# Patient Record
Sex: Female | Born: 1995 | Race: Black or African American | Hispanic: No | Marital: Single | Smoking: Never smoker
Health system: Southern US, Community
[De-identification: ages and names within clinical notes are randomized; demographics above are authoritative.]

## PROBLEM LIST (undated history)

## (undated) DIAGNOSIS — J45909 Unspecified asthma, uncomplicated: Secondary | ICD-10-CM

## (undated) DIAGNOSIS — F32A Depression, unspecified: Secondary | ICD-10-CM

## (undated) DIAGNOSIS — F419 Anxiety disorder, unspecified: Secondary | ICD-10-CM

## (undated) DIAGNOSIS — K589 Irritable bowel syndrome without diarrhea: Secondary | ICD-10-CM

## (undated) DIAGNOSIS — K2970 Gastritis, unspecified, without bleeding: Secondary | ICD-10-CM

## (undated) DIAGNOSIS — K219 Gastro-esophageal reflux disease without esophagitis: Secondary | ICD-10-CM

## (undated) DIAGNOSIS — M1990 Unspecified osteoarthritis, unspecified site: Secondary | ICD-10-CM

## (undated) HISTORY — PX: WISDOM TOOTH EXTRACTION: SHX21

## (undated) HISTORY — DX: Anxiety disorder, unspecified: F41.9

## (undated) HISTORY — DX: Unspecified osteoarthritis, unspecified site: M19.90

## (undated) HISTORY — DX: Depression, unspecified: F32.A

---
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
## 2018-11-03 ENCOUNTER — Emergency Department (HOSPITAL_COMMUNITY)
Admission: EM | Admit: 2018-11-03 | Discharge: 2018-11-03 | Disposition: A | Discharge status: Home / Self Care | Payer: 59 | Attending: Emergency Medicine | Admitting: Emergency Medicine

## 2018-11-03 ENCOUNTER — Encounter (HOSPITAL_COMMUNITY): Payer: Self-pay

## 2018-11-03 ENCOUNTER — Other Ambulatory Visit: Payer: Self-pay

## 2018-11-03 ENCOUNTER — Emergency Department (HOSPITAL_COMMUNITY): Payer: 59

## 2018-11-03 DIAGNOSIS — F419 Anxiety disorder, unspecified: Secondary | ICD-10-CM | POA: Insufficient documentation

## 2018-11-03 DIAGNOSIS — R0602 Shortness of breath: Secondary | ICD-10-CM | POA: Diagnosis present

## 2018-11-03 LAB — BASIC METABOLIC PANEL
Anion gap: 12 (ref 5–15)
BUN: 11 mg/dL (ref 6–20)
CO2: 22 mmol/L (ref 22–32)
Calcium: 9.3 mg/dL (ref 8.9–10.3)
Chloride: 104 mmol/L (ref 98–111)
Creatinine, Ser: 0.76 mg/dL (ref 0.44–1.00)
GFR calc Af Amer: >60 mL/min (ref >60)
GFR calc non Af Amer: >60 mL/min (ref >60)
Glucose, Bld: 109 mg/dL — ABNORMAL HIGH (ref 70–99)
Potassium: 2.8 mmol/L — ABNORMAL LOW (ref 3.5–5.1)
Sodium: 138 mmol/L (ref 135–145)

## 2018-11-03 LAB — CBC
HCT: 38.6 % (ref 36.0–46.0)
Hemoglobin: 12.3 g/dL (ref 12.0–15.0)
MCH: 32.6 pg (ref 26.0–34.0)
MCHC: 31.9 g/dL (ref 30.0–36.0)
MCV: 102.4 fL — ABNORMAL HIGH (ref 80.0–100.0)
Platelets: 242 10*3/uL (ref 150–400)
RBC: 3.77 MIL/uL — ABNORMAL LOW (ref 3.87–5.11)
RDW: 12.5 % (ref 11.5–15.5)
WBC: 8.9 10*3/uL (ref 4.0–10.5)
nRBC: 0 % (ref 0.0–0.2)

## 2018-11-03 LAB — I-STAT BETA HCG BLOOD, ED (MC, WL, AP ONLY): I-stat hCG, quantitative: <5 m[IU]/mL (ref <5)

## 2018-11-03 MED ORDER — ALBUTEROL SULFATE (2.5 MG/3ML) 0.083% IN NEBU
5.0000 mg | INHALATION_SOLUTION | Freq: Once | RESPIRATORY_TRACT | Status: AC
  Administered 2018-11-03: 5 mg via RESPIRATORY_TRACT
  Filled 2018-11-03: qty 6

## 2018-11-03 MED ORDER — PREDNISONE 20 MG PO TABS
60.0000 mg | ORAL_TABLET | Freq: Once | ORAL | Status: AC
  Administered 2018-11-03: 60 mg via ORAL
  Filled 2018-11-03: qty 3

## 2018-11-03 NOTE — ED Triage Notes (Signed)
Pt reports asthma attack starting about 30 mins ago. She reports that she used her rescue inhaler without relief. Wheezing and dry cough noted. A&Ox4.

## 2018-11-03 NOTE — ED Notes (Signed)
Patient transported to X-ray 

## 2018-11-03 NOTE — ED Provider Notes (Signed)
[facility]   CSN: [id number] Arrival date & time: 11/03/18  1523     History   Chief Complaint Chief Complaint  Patient presents with  . Asthma    HPI Michele Tate is a 23 y.o. female.  HPI Patient is a 23 year old female presents the emergency department he developed acute onset shortness of breath.  She has a history of asthma and reports no significant improvement with her bronchodilators.  She was wheezing for EMS.  She was given albuterol with some improvement in her symptoms.  She states that she still feels slightly anxious.  No chest pain.  No palpitations.  No syncope.  No abdominal pain.  No fevers or chills.  Some cough this week.  No other complaints.  Otherwise healthy 23 year old female.  No history of DVT or pulmonary embolism.  No family history of venous thromboembolic disease.   History reviewed. No pertinent past medical history.  There are no active problems to display for this patient.   History reviewed. No pertinent surgical history.   OB History   No obstetric history on file.      Home Medications    Prior to Admission medications   Not on File    Family History History reviewed. No pertinent family history.  Social History Social History   Tobacco Use  . Smoking status: Never Smoker  Substance Use Topics  . Alcohol use: No  . Drug use: No     Allergies   Penicillins   Review of Systems Review of Systems  All other systems reviewed and are negative.    Physical Exam Updated Vital Signs BP 132/86 (BP Location: Left Arm)   Pulse 86   Temp 98.1 F (36.7 C) (Oral)   Resp 20   LMP 10/29/2018   SpO2 100%   Physical Exam Vitals signs and nursing note reviewed.  Constitutional:      General: She is not in acute distress.    Appearance: She is well-developed.  HENT:     Head: Normocephalic and atraumatic.  Neck:     Musculoskeletal: Normal range of motion.    Cardiovascular:     Rate and Rhythm: Normal rate and regular rhythm.     Heart sounds: Normal heart sounds.  Pulmonary:     Effort: Pulmonary effort is normal.     Breath sounds: Normal breath sounds.  Abdominal:     General: There is no distension.     Palpations: Abdomen is soft.     Tenderness: There is no abdominal tenderness.  Musculoskeletal: Normal range of motion.  Skin:    General: Skin is warm and dry.  Neurological:     Mental Status: She is alert and oriented to person, place, and time.  Psychiatric:        Judgment: Judgment normal.      ED Treatments / Results  Labs (all labs ordered are listed, but only abnormal results are displayed) Labs Reviewed  CBC - Abnormal; Notable for the following components:      Result Value   RBC 3.77 (*)    MCV 102.4 (*)    All other components within normal limits  BASIC METABOLIC PANEL - Abnormal; Notable for the following components:   Potassium 2.8 (*)    Glucose, Bld 109 (*)    All other components within normal limits  I-STAT BETA HCG BLOOD, ED ([facility], [facility], [facility] ONLY)    EKG None  Radiology Dg Chest 2  View  Result Date: 11/03/2018 CLINICAL DATA:  Wheezing and dry cough today.  Asthma. EXAM: CHEST - 2 VIEW COMPARISON:  None. FINDINGS: The lungs are clear. Heart size is normal. There is no pneumothorax or pleural fluid. No bony abnormality. IMPRESSION: Normal chest. Electronically Signed   By: Drusilla Kanner M.D.   On: 11/03/2018 16:47     Procedures Procedures (including critical care time)  Medications Ordered in ED Medications  albuterol (PROVENTIL) (2.5 MG/3ML) 0.083% nebulizer solution 5 mg (5 mg Nebulization Given 11/03/18 1548)  predniSONE (DELTASONE) tablet 60 mg (60 mg Oral Given 11/03/18 1653)     Initial Impression / Assessment and Plan / ED Course  I have reviewed the triage vital signs and the nursing notes.  Pertinent labs & imaging results that were available during my care of the patient were  reviewed by me and considered in my medical decision making (see chart for details).     Patient is overall well-appearing.  No wheezing on my examination.  Given prednisone for possible asthma exacerbation.  She has bronchodilators at home.  She seems slightly anxious and some of this may represent a panic attack.  Doubt PE.  Patient is PERC negative.  Patient understands return to the ER for new or worsening symptoms.  Close primary care follow-up.    Final Clinical Impressions(s) / ED Diagnoses   Final diagnoses:  Shortness of breath  Anxiety    ED Discharge Orders    None       Azalia Bilis, MD 11/03/18 1819

## 2018-11-21 ENCOUNTER — Other Ambulatory Visit: Payer: Self-pay | Admitting: Family Medicine

## 2018-11-21 ENCOUNTER — Other Ambulatory Visit (HOSPITAL_COMMUNITY): Payer: Self-pay | Admitting: Family Medicine

## 2018-11-21 ENCOUNTER — Ambulatory Visit (HOSPITAL_COMMUNITY)
Admission: RE | Admit: 2018-11-21 | Discharge: 2018-11-21 | Disposition: A | Discharge status: Home / Self Care | Payer: POS | Source: Ambulatory Visit | Attending: Family Medicine | Admitting: Family Medicine

## 2018-11-21 ENCOUNTER — Encounter (HOSPITAL_COMMUNITY): Payer: Self-pay | Admitting: Emergency Medicine

## 2018-11-21 ENCOUNTER — Emergency Department (HOSPITAL_COMMUNITY): Payer: POS

## 2018-11-21 ENCOUNTER — Inpatient Hospital Stay (HOSPITAL_COMMUNITY)
Admission: EM | Admit: 2018-11-21 | Discharge: 2018-11-25 | DRG: 201 | Disposition: A | Discharge status: Home / Self Care | Payer: POS | Attending: Internal Medicine | Admitting: Internal Medicine

## 2018-11-21 DIAGNOSIS — F419 Anxiety disorder, unspecified: Secondary | ICD-10-CM | POA: Diagnosis present

## 2018-11-21 DIAGNOSIS — D72829 Elevated white blood cell count, unspecified: Secondary | ICD-10-CM | POA: Diagnosis present

## 2018-11-21 DIAGNOSIS — J982 Interstitial emphysema: Secondary | ICD-10-CM | POA: Diagnosis not present

## 2018-11-21 DIAGNOSIS — R0602 Shortness of breath: Secondary | ICD-10-CM | POA: Insufficient documentation

## 2018-11-21 DIAGNOSIS — R112 Nausea with vomiting, unspecified: Secondary | ICD-10-CM | POA: Diagnosis present

## 2018-11-21 DIAGNOSIS — Z7952 Long term (current) use of systemic steroids: Secondary | ICD-10-CM

## 2018-11-21 DIAGNOSIS — E876 Hypokalemia: Secondary | ICD-10-CM | POA: Diagnosis present

## 2018-11-21 DIAGNOSIS — Z791 Long term (current) use of non-steroidal anti-inflammatories (NSAID): Secondary | ICD-10-CM

## 2018-11-21 DIAGNOSIS — R079 Chest pain, unspecified: Secondary | ICD-10-CM

## 2018-11-21 DIAGNOSIS — F410 Panic disorder [episodic paroxysmal anxiety] without agoraphobia: Secondary | ICD-10-CM | POA: Diagnosis present

## 2018-11-21 DIAGNOSIS — Z79899 Other long term (current) drug therapy: Secondary | ICD-10-CM

## 2018-11-21 DIAGNOSIS — K229 Disease of esophagus, unspecified: Secondary | ICD-10-CM

## 2018-11-21 DIAGNOSIS — J45909 Unspecified asthma, uncomplicated: Secondary | ICD-10-CM | POA: Diagnosis present

## 2018-11-21 DIAGNOSIS — Z818 Family history of other mental and behavioral disorders: Secondary | ICD-10-CM

## 2018-11-21 DIAGNOSIS — Z8249 Family history of ischemic heart disease and other diseases of the circulatory system: Secondary | ICD-10-CM

## 2018-11-21 HISTORY — DX: Unspecified asthma, uncomplicated: J45.909

## 2018-11-21 LAB — COMPREHENSIVE METABOLIC PANEL
ALK PHOS: 62 U/L (ref 38–126)
ALT: 22 U/L (ref 0–44)
AST: 20 U/L (ref 15–41)
Albumin: 3.9 g/dL (ref 3.5–5.0)
Anion gap: 13 (ref 5–15)
BUN: 11 mg/dL (ref 6–20)
CO2: 16 mmol/L — ABNORMAL LOW (ref 22–32)
Calcium: 9.1 mg/dL (ref 8.9–10.3)
Chloride: 108 mmol/L (ref 98–111)
Creatinine, Ser: 1.05 mg/dL — ABNORMAL HIGH (ref 0.44–1.00)
GFR calc Af Amer: >60 mL/min (ref >60)
GFR calc non Af Amer: >60 mL/min (ref >60)
Glucose, Bld: 84 mg/dL (ref 70–99)
Potassium: 3 mmol/L — ABNORMAL LOW (ref 3.5–5.1)
Sodium: 137 mmol/L (ref 135–145)
Total Bilirubin: 1.1 mg/dL (ref 0.3–1.2)
Total Protein: 7.1 g/dL (ref 6.5–8.1)

## 2018-11-21 LAB — LACTIC ACID, PLASMA: Lactic Acid, Venous: 2.1 mmol/L (ref 0.5–1.9)

## 2018-11-21 LAB — I-STAT BETA HCG BLOOD, ED (MC, WL, AP ONLY): I-stat hCG, quantitative: <5 m[IU]/mL (ref <5)

## 2018-11-21 LAB — MAGNESIUM: MAGNESIUM: 2 mg/dL (ref 1.7–2.4)

## 2018-11-21 LAB — CBC WITH DIFFERENTIAL/PLATELET
Abs Immature Granulocytes: 0.05 10*3/uL (ref 0.00–0.07)
BASOS ABS: 0.1 10*3/uL (ref 0.0–0.1)
BASOS PCT: 1 %
Eosinophils Absolute: 0.1 10*3/uL (ref 0.0–0.5)
Eosinophils Relative: 1 %
HCT: 37.4 % (ref 36.0–46.0)
Hemoglobin: 12.8 g/dL (ref 12.0–15.0)
Immature Granulocytes: 1 %
Lymphocytes Relative: 29 %
Lymphs Abs: 3.2 10*3/uL (ref 0.7–4.0)
MCH: 32.2 pg (ref 26.0–34.0)
MCHC: 34.2 g/dL (ref 30.0–36.0)
MCV: 94.2 fL (ref 80.0–100.0)
Monocytes Absolute: 0.8 10*3/uL (ref 0.1–1.0)
Monocytes Relative: 7 %
NRBC: 0 % (ref 0.0–0.2)
Neutro Abs: 6.8 10*3/uL (ref 1.7–7.7)
Neutrophils Relative %: 61 %
Platelets: 243 10*3/uL (ref 150–400)
RBC: 3.97 MIL/uL (ref 3.87–5.11)
RDW: 12.5 % (ref 11.5–15.5)
WBC: 11.1 10*3/uL — ABNORMAL HIGH (ref 4.0–10.5)

## 2018-11-21 LAB — TSH: TSH: 0.971 u[IU]/mL (ref 0.350–4.500)

## 2018-11-21 LAB — PHOSPHORUS: PHOSPHORUS: 1.1 mg/dL — AB (ref 2.5–4.6)

## 2018-11-21 MED ORDER — MORPHINE SULFATE (PF) 2 MG/ML IV SOLN
2.0000 mg | Freq: Once | INTRAVENOUS | Status: AC
  Administered 2018-11-21: 2 mg via INTRAVENOUS
  Filled 2018-11-21: qty 1

## 2018-11-21 MED ORDER — DIATRIZOATE MEGLUMINE & SODIUM 66-10 % PO SOLN
ORAL | Status: AC
  Filled 2018-11-21: qty 120

## 2018-11-21 MED ORDER — LORAZEPAM 2 MG/ML IJ SOLN
1.0000 mg | Freq: Once | INTRAMUSCULAR | Status: AC
  Administered 2018-11-21: 1 mg via INTRAVENOUS
  Filled 2018-11-21: qty 1

## 2018-11-21 MED ORDER — IOPAMIDOL (ISOVUE-370) INJECTION 76%
80.0000 mL | Freq: Once | INTRAVENOUS | Status: AC | PRN
  Administered 2018-11-21: 50 mL via INTRAVENOUS

## 2018-11-21 MED ORDER — POTASSIUM CHLORIDE CRYS ER 20 MEQ PO TBCR
40.0000 meq | EXTENDED_RELEASE_TABLET | Freq: Once | ORAL | Status: AC
  Administered 2018-11-21: 40 meq via ORAL
  Filled 2018-11-21: qty 2

## 2018-11-21 MED ORDER — LACTATED RINGERS IV SOLN
INTRAVENOUS | Status: DC
  Administered 2018-11-21 – 2018-11-22 (×4): via INTRAVENOUS

## 2018-11-21 MED ORDER — PANTOPRAZOLE SODIUM 40 MG IV SOLR
40.0000 mg | Freq: Once | INTRAVENOUS | Status: AC
  Administered 2018-11-21: 40 mg via INTRAVENOUS
  Filled 2018-11-21: qty 40

## 2018-11-21 MED ORDER — POTASSIUM CHLORIDE 10 MEQ/100ML IV SOLN
10.0000 meq | Freq: Once | INTRAVENOUS | Status: AC
  Administered 2018-11-21: 10 meq via INTRAVENOUS
  Filled 2018-11-21: qty 100

## 2018-11-21 MED ORDER — PIPERACILLIN-TAZOBACTAM 3.375 G IVPB 30 MIN
3.3750 g | Freq: Once | INTRAVENOUS | Status: DC
  Filled 2018-11-21: qty 50

## 2018-11-21 NOTE — ED Notes (Signed)
Patient currently at radiology.

## 2018-11-21 NOTE — ED Notes (Signed)
Patient refused 2nd blood culture specimen collection .

## 2018-11-21 NOTE — ED Notes (Signed)
Transported to radiology 

## 2018-11-21 NOTE — ED Triage Notes (Signed)
Pt here for outpatient radiology scan.  Radiologist called Charge RN stating she had an abnormal scan and needed to be seen by ED provider.  Patient brought to room no shortness of breath, does have emesis and nausea.

## 2018-11-22 ENCOUNTER — Other Ambulatory Visit: Payer: Self-pay

## 2018-11-22 ENCOUNTER — Encounter (HOSPITAL_COMMUNITY): Payer: Self-pay | Admitting: Internal Medicine

## 2018-11-22 DIAGNOSIS — J4520 Mild intermittent asthma, uncomplicated: Secondary | ICD-10-CM

## 2018-11-22 DIAGNOSIS — J982 Interstitial emphysema: Secondary | ICD-10-CM | POA: Diagnosis not present

## 2018-11-22 DIAGNOSIS — E876 Hypokalemia: Secondary | ICD-10-CM | POA: Diagnosis not present

## 2018-11-22 DIAGNOSIS — F419 Anxiety disorder, unspecified: Secondary | ICD-10-CM | POA: Diagnosis not present

## 2018-11-22 DIAGNOSIS — J45909 Unspecified asthma, uncomplicated: Secondary | ICD-10-CM | POA: Diagnosis present

## 2018-11-22 LAB — PROTIME-INR
INR: 1.07
Prothrombin Time: 13.8 seconds (ref 11.4–15.2)

## 2018-11-22 LAB — LACTIC ACID, PLASMA: Lactic Acid, Venous: 1.2 mmol/L (ref 0.5–1.9)

## 2018-11-22 MED ORDER — ACETAMINOPHEN 325 MG PO TABS
650.0000 mg | ORAL_TABLET | Freq: Four times a day (QID) | ORAL | Status: DC | PRN
  Administered 2018-11-22 – 2018-11-23 (×3): 650 mg via ORAL
  Filled 2018-11-22 (×3): qty 2

## 2018-11-22 MED ORDER — IPRATROPIUM-ALBUTEROL 0.5-2.5 (3) MG/3ML IN SOLN: 3.0000 mL | RESPIRATORY_TRACT | Status: DC

## 2018-11-22 MED ORDER — SENNOSIDES-DOCUSATE SODIUM 8.6-50 MG PO TABS: 1.0000 | ORAL_TABLET | Freq: Every evening | ORAL | Status: DC | PRN

## 2018-11-22 MED ORDER — ONDANSETRON HCL 4 MG PO TABS
4.0000 mg | ORAL_TABLET | Freq: Four times a day (QID) | ORAL | Status: DC | PRN
  Administered 2018-11-23: 4 mg via ORAL
  Filled 2018-11-22: qty 1

## 2018-11-22 MED ORDER — IPRATROPIUM-ALBUTEROL 0.5-2.5 (3) MG/3ML IN SOLN: 3.0000 mL | RESPIRATORY_TRACT | Status: DC | PRN

## 2018-11-22 MED ORDER — ONDANSETRON HCL 4 MG/2ML IJ SOLN: 4.0000 mg | Freq: Four times a day (QID) | INTRAMUSCULAR | Status: DC | PRN

## 2018-11-22 MED ORDER — ALBUTEROL SULFATE (2.5 MG/3ML) 0.083% IN NEBU: 2.5000 mg | INHALATION_SOLUTION | RESPIRATORY_TRACT | Status: DC | PRN

## 2018-11-22 MED ORDER — LORAZEPAM 2 MG/ML IJ SOLN
0.5000 mg | Freq: Three times a day (TID) | INTRAMUSCULAR | Status: DC | PRN
  Administered 2018-11-22 – 2018-11-23 (×3): 0.5 mg via INTRAVENOUS
  Filled 2018-11-22 (×3): qty 1

## 2018-11-22 MED ORDER — HYDROXYZINE HCL 25 MG PO TABS: 50.0000 mg | ORAL_TABLET | Freq: Four times a day (QID) | ORAL | Status: DC | PRN

## 2018-11-22 MED ORDER — DM-GUAIFENESIN ER 30-600 MG PO TB12: 1.0000 | ORAL_TABLET | Freq: Two times a day (BID) | ORAL | Status: DC | PRN

## 2018-11-22 NOTE — ED Notes (Signed)
Pt.refuse  Blood cultures 2nd set

## 2018-11-22 NOTE — ED Notes (Addendum)
Pt feeling anxious and crying about IV, this RN flushed IV and observed great blood return, pt requesting Ativan. Will continue to monitor.

## 2018-11-22 NOTE — ED Notes (Signed)
Pt ambulatory to bathroom with no reported issues. 

## 2018-11-22 NOTE — ED Notes (Signed)
Pt complains of a headache, a 7 out of 10. Pt reports this is her usual type of headache.

## 2018-11-22 NOTE — Consult Note (Addendum)
[address]       [place] [address]             [phone]      Reason for Consult:pneumomediastinum Referring Physician: ER  Michele Tate is an 23 y.o. female.  HPI: The patient is a 23 year old female who presented to the emergency department this morning with new onset of shortness of breath as well as chest pain.  A CTA was obtained which revealed a pneumomediastinum.  She has a known history of asthma as well as significant anxiety.  She states that she has been having chest pain and shortness of breath for more than 2 weeks.  The pain is described as central and is intermittent in nature.  She describes it as severe at times.  There is some radiation to the back on occasion.  Patient also has a cough with clear sputum production.  She denies fevers or chills.  She was started on prednisone on 216.  She took it for a couple days with no significant improvement.  She was seen by her primary care physician and had a CT angiogram of the chest which showed mediastinal air and was sent to the emergency department for further evaluation.  We are asked to see the patient in cardiothoracic surgical consultation for further assistance with management.  In the emergency department she was found to have a low-grade leukocytosis with a white blood cell count 11.1.  Oxygen saturations have been good and she has remained hemodynamically stable although slightly tachycardic.  A Gastrografin barium swallow was done which was negative for esophageal perforation.  She is to be admitted by the hospitalist.  History reviewed. No pertinent past medical history. history OF INHALER USE FOR PAST 8-10 YEARS   History reviewed. No pertinent surgical history.  Family History  Problem Relation Age of Onset  . Anxiety disorder Mother   . Hypertension Father   . Gout Father   . Anxiety disorder Sister     Social History:  reports that she has never smoked. She has never used smokeless  tobacco. She reports that she does not drink alcohol or use drugs.  Allergies:  Allergies  Allergen Reactions  . Singulair [Montelukast] Other (See Comments)    "Limbs go numb"  . Penicillins Rash    Medications: . diatrizoate meglumine-sodium        Results for orders placed or performed during the hospital encounter of 11/21/18 (from the past 48 hour(s))  Comprehensive metabolic panel     Status: Abnormal   Collection Time: 11/21/18  7:55 PM  Result Value Ref Range   Sodium 137 135 - 145 mmol/L   Potassium 3.0 (L) 3.5 - 5.1 mmol/L   Chloride 108 98 - 111 mmol/L   CO2 16 (L) 22 - 32 mmol/L   Glucose, Bld 84 70 - 99 mg/dL   BUN 11 6 - 20 mg/dL   Creatinine, Ser 2.35 (H) 0.44 - 1.00 mg/dL   Calcium 9.1 8.9 - 57.3 mg/dL   Total Protein 7.1 6.5 - 8.1 g/dL   Albumin 3.9 3.5 - 5.0 g/dL   AST 20 15 - 41 U/L   ALT 22 0 - 44 U/L   Alkaline Phosphatase 62 38 - 126 U/L   Total Bilirubin 1.1 0.3 - 1.2 mg/dL   GFR calc non Af Amer >60 >60 mL/min   GFR calc Af Amer >60 >60 mL/min   Anion gap 13 5 - 15  Comment: Performed at [facility], [address], [place], [place] [address]  CBC with Differential     Status: Abnormal   Collection Time: 11/21/18  7:55 PM  Result Value Ref Range   WBC 11.1 (H) 4.0 - 10.5 K/uL   RBC 3.97 3.87 - 5.11 MIL/uL   Hemoglobin 12.8 12.0 - 15.0 g/dL   HCT 71.0 62.6 - 94.8 %   MCV 94.2 80.0 - 100.0 fL   MCH 32.2 26.0 - 34.0 pg   MCHC 34.2 30.0 - 36.0 g/dL   RDW 54.6 27.0 - 35.0 %   Platelets 243 150 - 400 K/uL   nRBC 0.0 0.0 - 0.2 %   Neutrophils Relative % 61 %   Neutro Abs 6.8 1.7 - 7.7 K/uL   Lymphocytes Relative 29 %   Lymphs Abs 3.2 0.7 - 4.0 K/uL   Monocytes Relative 7 %   Monocytes Absolute 0.8 0.1 - 1.0 K/uL   Eosinophils Relative 1 %   Eosinophils Absolute 0.1 0.0 - 0.5 K/uL   Basophils Relative 1 %   Basophils Absolute 0.1 0.0 - 0.1 K/uL   Immature Granulocytes 1 %   Abs Immature Granulocytes 0.05 0.00 - 0.07 K/uL     Comment: Performed at [facility], [address] [place], [place] [address]  Magnesium     Status: None   Collection Time: 11/21/18  7:55 PM  Result Value Ref Range   Magnesium 2.0 1.7 - 2.4 mg/dL    Comment: Performed at [facility], [address] [place], [place] [address]  Phosphorus     Status: Abnormal   Collection Time: 11/21/18  7:55 PM  Result Value Ref Range   Phosphorus 1.1 (L) 2.5 - 4.6 mg/dL    Comment: Performed at [facility], [address] [place], [place] [address]  TSH     Status: None   Collection Time: 11/21/18  7:55 PM  Result Value Ref Range   TSH 0.971 0.350 - 4.500 uIU/mL    Comment: Performed by a 3rd Generation assay with a functional sensitivity of <=0.01 uIU/mL. Performed at [facility], [address] [place], [place] [address]   Lactic acid, plasma     Status: Abnormal   Collection Time: 11/21/18  7:55 PM  Result Value Ref Range   Lactic Acid, Venous 2.1 (HH) 0.5 - 1.9 mmol/L    Comment: CRITICAL RESULT CALLED TO, READ BACK BY AND VERIFIED WITH: Santina Evans B,RN 11/21/18 2047 WAYK Performed at [facility], [address], [place], [place] [address]   I-Stat beta hCG blood, ED     Status: None   Collection Time: 11/21/18  8:14 PM  Result Value Ref Range   I-stat hCG, quantitative <5.0 <5 mIU/mL   Comment 3            Comment:   GEST. AGE      CONC.  (mIU/mL)   <=1 WEEK        5 - 50     2 WEEKS       50 - 500     3 WEEKS       100 - 10,000     4 WEEKS     1,000 - 30,000        FEMALE AND NON-PREGNANT FEMALE:     LESS THAN 5 mIU/mL   Lactic acid, plasma     Status: None   Collection Time:  11/22/18 12:22 AM  Result Value Ref Range   Lactic Acid, Venous 1.2 0.5 - 1.9 mmol/L    Comment: Performed at [facility], [address] [place], [place] [id number]    Ct Angio Chest Pe W Or Wo Contrast  Result Date: 11/21/2018 CLINICAL DATA:  Shortness of breath and chest pain. Vomiting today after panic  attack. EXAM: CT ANGIOGRAPHY CHEST WITH CONTRAST TECHNIQUE: Multidetector CT imaging of the chest was performed using the standard protocol during bolus administration of intravenous contrast. Multiplanar CT image reconstructions and MIPs were obtained to evaluate the vascular anatomy. CONTRAST:  50mL ISOVUE-370 IOPAMIDOL (ISOVUE-370) INJECTION 76% COMPARISON:  None. FINDINGS: Cardiovascular: The pulmonary arteries are well opacified. There is no evidence of pulmonary embolism. Central pulmonary arteries are of normal caliber. The thoracic aorta is normal in caliber. The heart size is normal. No pericardial fluid. No coronary artery calcifications. Mediastinum/Nodes: There is evidence abnormal air in the posterior mediastinum beginning at the level of the thoracic inlet and tracking along the course of the esophagus inferiorly all the way to the diaphragmatic hiatus. Air outlines the distal esophagus in particular and findings are most likely due to esophageal perforation. The esophagus itself is not dilated and does not contain fluid, debris or foreign object. There is no evidence of an esophageal mass. No enlarged mediastinal, hilar, axillary or paraesophageal lymph nodes identified. Lungs/Pleura: There is no evidence of pulmonary edema, consolidation, pneumothorax, nodule or pleural fluid. Upper Abdomen: No acute abnormality. Musculoskeletal: No chest wall abnormality. No acute or significant osseous findings. Review of the MIP images confirms the above findings. IMPRESSION: Pneumomediastinum within the posterior mediastinum surrounding the esophagus with air extending from the thoracic inlet to the diaphragmatic hiatus. 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 These results were called by telephone at the time of interpretation on 11/21/2018 at 6:40 pm to Dr. Irena ReichmannANA COLLINS , who verbally acknowledged these results. Due to the emergent findings, the  patient will be [facility] Emergency Department urgently. Electronically Signed   By: Irish LackGlenn  Yamagata M.D.   On: 11/21/2018 18:43   Dg Esophagus W Single Cm (sol Or Thin Ba)  Result Date: 11/21/2018 CLINICAL DATA:  23 year old female who had been vomiting found to have pneumomediastinum and gas surrounding the esophagus on Chest CT today. EXAM: ESOPHOGRAM/BARIUM SWALLOW TECHNIQUE: Single contrast examination was performed using water-soluble contrast and ultimately thin barium. FLUOROSCOPY TIME:  Fluoroscopy Time:  0 minutes 48 seconds Radiation Exposure Index (if provided by the fluoroscopic device): Number of Acquired Spot Images: 0 COMPARISON:  Chest CT and radiographs earlier today. FINDINGS: A single contrast study was undertaken and the patient tolerated this well and without difficulty. No obstruction to the forward flow of contrast throughout the esophagus and into the stomach. Normal esophageal course and contour. Normal gastroesophageal junction.  Negative for esophageal leak. IMPRESSION: Negative single contrast esophagram. No evidence of esophageal perforation. Electronically Signed   By: Odessa FlemingH  Hall M.D.   On: 11/21/2018 23:31    Review of Systems  Constitutional: Negative for chills, diaphoresis, fever, malaise/fatigue and weight loss.  HENT: Negative.   Eyes: Negative.   Respiratory: Positive for cough, sputum production, shortness of breath and wheezing.   Cardiovascular: Positive for chest pain.  Gastrointestinal: Negative for abdominal pain, blood in stool, constipation, diarrhea, heartburn, melena, nausea and vomiting.  Genitourinary: Negative.   Musculoskeletal: Negative.   Skin: Negative for rash.  Neurological: Positive for  dizziness, tingling and sensory change.  Psychiatric/Behavioral: The patient is nervous/anxious.    Blood pressure (!) 107/57, pulse 84, temperature 98.4 F (36.9 C), temperature source Oral, resp. rate 16, last menstrual period  10/29/2018, SpO2 99 %. Physical Exam  Constitutional: She is oriented to person, place, and time. She appears well-developed and well-nourished. No distress.  HENT:  Head: Normocephalic and atraumatic.  Mouth/Throat: Oropharynx is clear and moist. No oropharyngeal exudate.  Eyes: Pupils are equal, round, and reactive to light. Conjunctivae and EOM are normal. Right eye exhibits no discharge. Left eye exhibits no discharge. No scleral icterus.  Neck: Normal range of motion. Neck supple. No JVD present. No tracheal deviation present. No thyromegaly present.  Cardiovascular: Normal rate, regular rhythm, normal heart sounds and intact distal pulses. Exam reveals no gallop and no friction rub.  No murmur heard. Respiratory: No stridor. No respiratory distress. She has no wheezes. She has no rales. She exhibits no tenderness.  GI: Soft. Bowel sounds are normal. She exhibits no distension and no mass. There is no abdominal tenderness. There is no rebound and no guarding.  Musculoskeletal: Normal range of motion.        General: Tenderness present. No deformity or edema.  Lymphadenopathy:    She has no cervical adenopathy.  Neurological: She is alert and oriented to person, place, and time. She has normal reflexes.  Skin: Skin is warm and dry. No rash noted. She is not diaphoretic. No erythema. No pallor.  Psychiatric:  + anxious    Assessment/Plan: Pneumomediastinum with negative swallow study for esophageal perforation.  She does have a known history of asthma.  She also exercises regularly with some degree of exertion that may have played a component.  We will observe with conservative management at this point.  Will be admitted to medicine for primary management.  She would clearly benefit from counseling for her significant anxiety.  Rowe ClackWayne E Gold 11/22/2018, 9:28 AM   Patient seen and examined , xrays reviewed . Pneumomediastinum  Present , no evidence of esophageal perforation on swallow.  Unclear if pneummediastinum is from esophagus or lungs. Would recommend npo for 24 hours , then reintroduce clear liquids Consider pulmonary evaluation for underlying SOB, wheezing.  I have seen and examined Michele Tate and agree with the above assessment  and plan.  Delight OvensEdward B Rashee Marschall MD Bee[place] [phone] 11/22/2018 11:02 AM

## 2018-11-22 NOTE — H&P (Signed)
History and Physical    Michele Tate ZOX:[id number] DOB: 01/22/1996 DOA: 11/21/2018  Referring MD/NP/PA:   PCP: Irena Reichmannollins, Dana, DO   Patient coming from:  The patient is coming from home.  At baseline, pt is independent for most of ADL.        Chief Complaint: SOB and Chest pain and abnormal findings of CTA  HPI: Michele GrandchildKamari Tate is a 23 y.o. female with medical history significant of asthma, anxiety, who presents with chest pain or shortness breath and abnormal findings of CT angiogram  Patient states that she has been having shortness breath and intermittent chest pain for more than 2 weeks.  The chest pain is located in the central chest, intermittent, 8 out of 10 severity, sharp, radiating to the throat and the back sometimes.  Patient has cough with clear mucus production.  She has chills, no fever.  No tenderness in the calf areas.  She has nausea, no vomiting, diarrhea or abdominal pain.  Denies symptoms of UTI or unilateral weakness. She has been seen several times over the past 2 weeks for chest pain and shortness of breath.  Patient reports that she was diagnosed with anxiety.  She reports she does become very anxious. She was started on prednisone on 2/16. She took it for two day, no significant improvement.  Pt was seen by PCP and had CT angiogram of chest, which showed mediastinal air, therefore pt was sent to ED for further evaluation treatment.  ED Course: pt was found to have WBC 11.1, potassium 3.0, creatinine 1.05, GFR>84, magnesium 2.0, lactic acid 2.1, negative pregnancy test, temperature normal, initially slightly tachycardia, currently heart rate 70s, oxygen satting 97% on room air. Pt is placed on stepdown bed for observation.  CT surgeon, Dr. Tyrone SageGerhardt was consulted by EDP, he recommended gastrografin barium swallow which is negative for esophageal perforation.  # CTA showed: Pneumomediastinum within the posterior mediastinum surrounding the esophagus with air extending from the  thoracic inlet to the diaphragmatic hiatus.    Review of Systems:   General: no fevers, chills, no body weight gain, has fatigue HEENT: no blurry vision, hearing changes or sore throat Respiratory: has dyspnea, coughing, no wheezing CV: has chest pain, no palpitations GI: no nausea, vomiting, abdominal pain, diarrhea, constipation GU: no dysuria, burning on urination, increased urinary frequency, hematuria  Ext: no leg edema Neuro: no unilateral weakness, numbness, or tingling, no vision change or hearing loss Skin: no rash, no skin tear. MSK: No muscle spasm, no deformity, no limitation of range of movement in spin Heme: No easy bruising.  Travel history: No recent long distant travel.  Allergy:  Allergies  Allergen Reactions  . Singulair [Montelukast] Other (See Comments)    "Limbs go numb"  . Penicillins Rash    History reviewed. No pertinent past medical history.  History reviewed. No pertinent surgical history.  Social History:  reports that she has never smoked. She has never used smokeless tobacco. She reports that she does not drink alcohol or use drugs.  Family History:  Family History  Problem Relation Age of Onset  . Anxiety disorder Mother   . Hypertension Father   . Gout Father   . Anxiety disorder Sister      Prior to Admission medications   Medication Sig Start Date End Date Taking? Authorizing Provider  acetaminophen (TYLENOL) 325 MG tablet Take 650 mg by mouth every 6 (six) hours as needed for mild pain.   Yes [provider]  hydrOXYzine (ATARAX/VISTARIL) 50  MG tablet Take 50 mg by mouth every 6 (six) hours as needed for anxiety.  11/07/18  Yes [provider]  ibuprofen (ADVIL,MOTRIN) 200 MG tablet Take 200 mg by mouth every 6 (six) hours as needed for mild pain.   Yes [provider]  predniSONE (DELTASONE) 20 MG tablet Take 40 mg by mouth daily. 11/19/18  Yes [provider]    Physical Exam: Vitals:   11/22/18  0200 11/22/18 0230 11/22/18 0415 11/22/18 0434  BP: (!) 97/57 (!) 104/53 (!) 98/48 (!) 100/58  Pulse: 67 73 76 64  Resp:  16 16 16   Temp:      TempSrc:      SpO2: 99% 98% 98% 99%   General: Not in acute distress HEENT:       Eyes: PERRL, EOMI, no scleral icterus.       ENT: No discharge from the ears and nose, no pharynx injection, no tonsillar enlargement.        Neck: No JVD, no bruit, no mass felt. Heme: No neck lymph node enlargement. Cardiac: S1/S2, RRR, No murmurs, No gallops or rubs. Respiratory: No rales, wheezing, rhonchi or rubs. GI: Soft, nondistended, nontender, no rebound pain, no organomegaly, BS present. GU: No hematuria Ext: No pitting leg edema bilaterally. 2+DP/PT pulse bilaterally. Musculoskeletal: No joint deformities, No joint redness or warmth, no limitation of ROM in spin. Skin: No rashes.  Neuro: Alert, oriented X3, cranial nerves II-XII grossly intact, moves all extremities normally. Psych: Patient is not psychotic, no suicidal or hemocidal ideation.  Labs on Admission: I have personally reviewed following labs and imaging studies  CBC: Recent Labs  Lab 11/21/18 1955  WBC 11.1*  NEUTROABS 6.8  HGB 12.8  HCT 37.4  MCV 94.2  PLT 243   Basic Metabolic Panel: Recent Labs  Lab 11/21/18 1955  NA 137  K 3.0*  CL 108  CO2 16*  GLUCOSE 84  BUN 11  CREATININE 1.05*  CALCIUM 9.1  MG 2.0  PHOS 1.1*   GFR: CrCl cannot be calculated (Unknown ideal weight.). Liver Function Tests: Recent Labs  Lab 11/21/18 1955  AST 20  ALT 22  ALKPHOS 62  BILITOT 1.1  PROT 7.1  ALBUMIN 3.9   No results for input(s): LIPASE, AMYLASE in the last 168 hours. No results for input(s): AMMONIA in the last 168 hours. Coagulation Profile: No results for input(s): INR, PROTIME in the last 168 hours. Cardiac Enzymes: No results for input(s): CKTOTAL, CKMB, CKMBINDEX, TROPONINI in the last 168 hours. BNP (last 3 results) No results for input(s): PROBNP in the last  8760 hours. HbA1C: No results for input(s): HGBA1C in the last 72 hours. CBG: No results for input(s): GLUCAP in the last 168 hours. Lipid Profile: No results for input(s): CHOL, HDL, LDLCALC, TRIG, CHOLHDL, LDLDIRECT in the last 72 hours. Thyroid Function Tests: Recent Labs    11/21/18 1955  TSH 0.971   Anemia Panel: No results for input(s): VITAMINB12, FOLATE, FERRITIN, TIBC, IRON, RETICCTPCT in the last 72 hours. Urine analysis: No results found for: COLORURINE, APPEARANCEUR, LABSPEC, PHURINE, GLUCOSEU, HGBUR, BILIRUBINUR, KETONESUR, PROTEINUR, UROBILINOGEN, NITRITE, LEUKOCYTESUR Sepsis Labs: @LABRCNTIP (procalcitonin:4,lacticidven:4) )No results found for this or any previous visit (from the past 240 hour(s)).   Radiological Exams on Admission: Ct Angio Chest Pe W Or Wo Contrast  Result Date: 11/21/2018 CLINICAL DATA:  Shortness of breath and chest pain. Vomiting today after panic attack. EXAM: CT ANGIOGRAPHY CHEST WITH CONTRAST TECHNIQUE: Multidetector CT imaging of the chest was  performed using the standard protocol during bolus administration of intravenous contrast. Multiplanar CT image reconstructions and MIPs were obtained to evaluate the vascular anatomy. CONTRAST:  50mL ISOVUE-370 IOPAMIDOL (ISOVUE-370) INJECTION 76% COMPARISON:  None. FINDINGS: Cardiovascular: The pulmonary arteries are well opacified. There is no evidence of pulmonary embolism. Central pulmonary arteries are of normal caliber. The thoracic aorta is normal in caliber. The heart size is normal. No pericardial fluid. No coronary artery calcifications. Mediastinum/Nodes: There is evidence abnormal air in the posterior mediastinum beginning at the level of the thoracic inlet and tracking along the course of the esophagus inferiorly all the way to the diaphragmatic hiatus. Air outlines the distal esophagus in particular and findings are most likely due to esophageal perforation. The esophagus itself is not dilated and  does not contain fluid, debris or foreign object. There is no evidence of an esophageal mass. No enlarged mediastinal, hilar, axillary or paraesophageal lymph nodes identified. Lungs/Pleura: There is no evidence of pulmonary edema, consolidation, pneumothorax, nodule or pleural fluid. Upper Abdomen: No acute abnormality. Musculoskeletal: No chest wall abnormality. No acute or significant osseous findings. Review of the MIP images confirms the above findings. IMPRESSION: Pneumomediastinum within the posterior mediastinum surrounding the esophagus with air extending from the thoracic inlet to the diaphragmatic hiatus. 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 These results were called by telephone at the time of interpretation on 11/21/2018 at 6:40 pm to Dr. Irena ReichmannANA COLLINS , who verbally acknowledged these results. Due to the emergent findings, the patient will be transported to [facility] Emergency Department urgently. Electronically Signed   By: Irish LackGlenn  Yamagata M.D.   On: 11/21/2018 18:43   Dg Esophagus W Single Cm (sol Or Thin Ba)  Result Date: 11/21/2018 CLINICAL DATA:  23 year old female who had been vomiting found to have pneumomediastinum and gas surrounding the esophagus on Chest CT today. EXAM: ESOPHOGRAM/BARIUM SWALLOW TECHNIQUE: Single contrast examination was performed using water-soluble contrast and ultimately thin barium. FLUOROSCOPY TIME:  Fluoroscopy Time:  0 minutes 48 seconds Radiation Exposure Index (if provided by the fluoroscopic device): Number of Acquired Spot Images: 0 COMPARISON:  Chest CT and radiographs earlier today. FINDINGS: A single contrast study was undertaken and the patient tolerated this well and without difficulty. No obstruction to the forward flow of contrast throughout the esophagus and into the stomach. Normal esophageal course and contour. Normal gastroesophageal junction.  Negative for esophageal leak.  IMPRESSION: Negative single contrast esophagram. No evidence of esophageal perforation. Electronically Signed   By: Odessa FlemingH  Hall M.D.   On: 11/21/2018 23:31     EKG: Reviewed independently.  Sinus rhythm, QTC 465, LAE, RAD.   Assessment/Plan Principal Problem:   Pneumomediastinum (HCC) Active Problems:   Asthma   Hypokalemia   Anxiety   Pneumomediastinum (HCC): Patient's chest pain is likely due to pneumomediastinum.  Patient has very low risk for ACS. EDP consultedd CT surgeon, Dr. Dr. Tyrone SageGerhardt. "Advises patient needs a Gastrografin barium swallow to rule out esophageal tear.  If negative, patient can be admitted to hospitalist service for overnight observation and pain control with cardiothoracic consult". The Gastrografin barium swallow test is negative.  -will placed on stepdown for observation -As needed Percocet for pain  Asthma: No wheezing or rhonchi on auscultation. - PRN DuoNeb nebulizers - Plan Mucinex for cough  Hypokalemia: K=3.0  on admission. Mg=2.0. - Repleted K  Anxiety: -prn ativan   DVT ppx: SCD Code Status: Full code Family Communication:  Yes,  patient's  mother  at bed side Disposition Plan:  Anticipate discharge back to previous home environment Consults called:  CT surgeon, Dr. Tyrone Sage Admission status:   SDU/obs      Date of Service 11/22/2018    Lorretta Harp [facility]   If 7PM-7AM, please contact night-coverage [email or web address] Password [email or web address] 11/22/2018, 6:30 AM

## 2018-11-22 NOTE — ED Provider Notes (Signed)
[facility] EMERGENCY DEPARTMENT Provider Note   CSN: [id number] Arrival date & time: 11/21/18  1854    History   Chief Complaint Chief Complaint  Patient presents with  . Abnormal Lab  . Emesis    HPI Michele Tate is a 23 y.o. female.     HPI Patient was referred by her PCP to outpatient CT scan today.  Patient was sent from CT scan to the emergency department for finding of mediastinal air for further evaluation.  Patient reports that she has had several weeks of chest pain.  She reports that she has had coughing and today particularly brought up a lot of clear mucus.  She reports that she does have a history of asthma.  She reports she has pain that is predominantly up around the top of her chest down below the sternal notch and little bit into her back.  She has been seen several times over the past 2 weeks for chest pain shortness of breath.  Patient reports that she has been diagnosed with anxiety.  She reports she does become very anxious.  No lower extremity swelling or calf pain.  Patient reports that as of yesterday she did have a couple episodes of vomiting.  No blood.  Patient reports she has been seen in the emergency department several times and has been treated for asthma and anxiety. History reviewed. No pertinent past medical history.  Patient Active Problem List   Diagnosis Date Noted  . Pneumomediastinum (HCC) 11/22/2018  . Asthma 11/22/2018  . Hypokalemia 11/22/2018    History reviewed. No pertinent surgical history.   OB History   No obstetric history on file.      Home Medications    Prior to Admission medications   Medication Sig Start Date End Date Taking? Authorizing Provider  acetaminophen (TYLENOL) 325 MG tablet Take 650 mg by mouth every 6 (six) hours as needed for mild pain.   Yes [provider]  hydrOXYzine (ATARAX/VISTARIL) 50 MG tablet Take 50 mg by mouth every 6 (six) hours as needed for anxiety.  11/07/18  Yes  [provider]  ibuprofen (ADVIL,MOTRIN) 200 MG tablet Take 200 mg by mouth every 6 (six) hours as needed for mild pain.   Yes [provider]  predniSONE (DELTASONE) 20 MG tablet Take 40 mg by mouth daily. 11/19/18  Yes [provider]    Family History History reviewed. No pertinent family history.  Social History Social History   Tobacco Use  . Smoking status: Never Smoker  . Smokeless tobacco: Never Used  Substance Use Topics  . Alcohol use: No  . Drug use: No     Allergies   Singulair [montelukast] and Penicillins   Review of Systems Review of Systems 10 Systems reviewed and are negative for acute change except as noted in the HPI.  Physical Exam Updated Vital Signs BP 113/65 (BP Location: Right Arm)   Pulse 79   Temp 98.4 F (36.9 C) (Oral)   Resp 18   LMP 10/29/2018   SpO2 97%   Physical Exam Constitutional:      Appearance: She is well-developed.     Comments: Patient is alert but very anxious and aggressively hyperventilating.  HENT:     Head: Normocephalic and atraumatic.     Mouth/Throat:     Mouth: Mucous membranes are moist.     Pharynx: Oropharynx is clear.  Eyes:     Pupils: Pupils are equal, round, and reactive to light.  Neck:     Musculoskeletal: Neck supple.  Cardiovascular:     Rate and Rhythm: Normal rate and regular rhythm.     Heart sounds: Normal heart sounds.  Pulmonary:     Effort: Pulmonary effort is normal.     Breath sounds: Normal breath sounds.     Comments: No appreciable wheeze rhonchi or rale.  Patient is hyperventilating and tachypneic. Abdominal:     General: Bowel sounds are normal. There is no distension.     Palpations: Abdomen is soft.     Tenderness: There is no abdominal tenderness.  Musculoskeletal: Normal range of motion.  Skin:    General: Skin is warm and dry.  Neurological:     General: No focal deficit present.     Mental Status: She is alert and oriented to person, place, and  time.     GCS: GCS eye subscore is 4. GCS verbal subscore is 5. GCS motor subscore is 6.     Coordination: Coordination normal.  Psychiatric:     Comments: Patient is extremely anxious.  She is interactive and appropriate.      ED Treatments / Results  Labs (all labs ordered are listed, but only abnormal results are displayed) Labs Reviewed  COMPREHENSIVE METABOLIC PANEL - Abnormal; Notable for the following components:      Result Value   Potassium 3.0 (*)    CO2 16 (*)    Creatinine, Ser 1.05 (*)    All other components within normal limits  CBC WITH DIFFERENTIAL/PLATELET - Abnormal; Notable for the following components:   WBC 11.1 (*)    All other components within normal limits  PHOSPHORUS - Abnormal; Notable for the following components:   Phosphorus 1.1 (*)    All other components within normal limits  LACTIC ACID, PLASMA - Abnormal; Notable for the following components:   Lactic Acid, Venous 2.1 (*)    All other components within normal limits  CULTURE, BLOOD (ROUTINE X 2)  CULTURE, BLOOD (ROUTINE X 2)  MAGNESIUM  TSH  LACTIC ACID, PLASMA  I-STAT BETA HCG BLOOD, ED ([facility], [facility], [facility] ONLY)    EKG None  Radiology Ct Angio Chest Pe W Or Wo Contrast  Result Date: 11/21/2018 CLINICAL DATA:  Shortness of breath and chest pain. Vomiting today after panic attack. EXAM: CT ANGIOGRAPHY CHEST WITH CONTRAST TECHNIQUE: Multidetector CT imaging of the chest was performed using the standard protocol during bolus administration of intravenous contrast. Multiplanar CT image reconstructions and MIPs were obtained to evaluate the vascular anatomy. CONTRAST:  71mL ISOVUE-370 IOPAMIDOL (ISOVUE-370) INJECTION 76% COMPARISON:  None. FINDINGS: Cardiovascular: The pulmonary arteries are well opacified. There is no evidence of pulmonary embolism. Central pulmonary arteries are of normal caliber. The thoracic aorta is normal in caliber. The heart size is normal. No pericardial fluid. No coronary  artery calcifications. Mediastinum/Nodes: There is evidence abnormal air in the posterior mediastinum beginning at the level of the thoracic inlet and tracking along the course of the esophagus inferiorly all the way to the diaphragmatic hiatus. Air outlines the distal esophagus in particular and findings are most likely due to esophageal perforation. The esophagus itself is not dilated and does not contain fluid, debris or foreign object. There is no evidence of an esophageal mass. No enlarged mediastinal, hilar, axillary or paraesophageal lymph nodes identified. Lungs/Pleura: There is no evidence of pulmonary edema, consolidation, pneumothorax, nodule or pleural fluid. Upper Abdomen: No acute abnormality. Musculoskeletal: No chest wall abnormality. No acute or significant osseous findings.  Review of the MIP images confirms the above findings. IMPRESSION: Pneumomediastinum within the posterior mediastinum surrounding the esophagus with air extending from the thoracic inlet to the diaphragmatic hiatus. 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 These results were called by telephone at the time of interpretation on 11/21/2018 at 6:40 pm to Dr. Irena ReichmannANA COLLINS , who verbally acknowledged these results. Due to the emergent findings, the patient will be transported to the [facility] Emergency Department urgently. Electronically Signed   By: Irish LackGlenn  Yamagata M.D.   On: 11/21/2018 18:43   Dg Esophagus W Single Cm (sol Or Thin Ba)  Result Date: 11/21/2018 CLINICAL DATA:  23 year old female who had been vomiting found to have pneumomediastinum and gas surrounding the esophagus on Chest CT today. EXAM: ESOPHOGRAM/BARIUM SWALLOW TECHNIQUE: Single contrast examination was performed using water-soluble contrast and ultimately thin barium. FLUOROSCOPY TIME:  Fluoroscopy Time:  0 minutes 48 seconds Radiation Exposure Index (if provided by the fluoroscopic device): Number  of Acquired Spot Images: 0 COMPARISON:  Chest CT and radiographs earlier today. FINDINGS: A single contrast study was undertaken and the patient tolerated this well and without difficulty. No obstruction to the forward flow of contrast throughout the esophagus and into the stomach. Normal esophageal course and contour. Normal gastroesophageal junction.  Negative for esophageal leak. IMPRESSION: Negative single contrast esophagram. No evidence of esophageal perforation. Electronically Signed   By: Odessa FlemingH  Hall M.D.   On: 11/21/2018 23:31    Procedures Procedures (including critical care time)  Medications Ordered in ED Medications  lactated ringers infusion ( Intravenous New Bag/Given 11/21/18 1947)  diatrizoate meglumine-sodium (GASTROGRAFIN) 66-10 % solution (has no administration in time range)  LORazepam (ATIVAN) injection 1 mg (1 mg Intravenous Given 11/21/18 1942)  pantoprazole (PROTONIX) injection 40 mg (40 mg Intravenous Given 11/21/18 1950)  morphine 2 MG/ML injection 2 mg (2 mg Intravenous Given 11/21/18 2059)  potassium chloride 10 mEq in 100 mL IVPB (0 mEq Intravenous Stopped 11/21/18 2352)  potassium chloride SA (K-DUR,KLOR-CON) CR tablet 40 mEq (40 mEq Oral Given 11/21/18 2222)  LORazepam (ATIVAN) injection 1 mg (1 mg Intravenous Given 11/21/18 2217)     Initial Impression / Assessment and Plan / ED Course  I have reviewed the triage vital signs and the nursing notes.  Pertinent labs & imaging results that were available during my care of the patient were reviewed by me and considered in my medical decision making (see chart for details).       Consult: Reviewed with Dr. Tyrone SageGerhardt cardiothoracic surgery.  Advises patient needs a Gastrografin barium swallow to rule out esophageal tear.  If negative, patient can be admitted to hospitalist service for overnight observation and pain control with cardiothoracic consult.  Consult: Reviewed with Dr. Youlanda RoysNui for admission.  Patient presents from  CT with mediastinal air.  Esophageal tear is been ruled out.  Patient is alert and nontoxic.  With pain control and anxiety control patient is clinically well in appearance.  Will be admitted for further observation.  Final Clinical Impressions(s) / ED Diagnoses   Final diagnoses:  Esophageal abnormality  Mediastinal air [facility](HCC)  Anxiety    ED Discharge Orders    None       Arby BarrettePfeiffer, Dae Highley, MD 11/22/18 [phone]

## 2018-11-22 NOTE — Progress Notes (Signed)
Subjective: The patient is resting comfortably. No new complaints.  Objective: Vital signs in last 24 hours: Temp:  [98.4 F (36.9 C)] 98.4 F (36.9 C) (02/18 1905) Pulse Rate:  [61-104] 66 (02/19 1303) Resp:  [13-25] 20 (02/19 1303) BP: (90-135)/(48-99) 108/81 (02/19 1303) SpO2:  [97 %-100 %] 98 % (02/19 1303) Weight change:     Intake/Output from previous day: No intake/output data recorded. Intake/Output this shift: No intake/output data recorded.  General appearance: alert, cooperative, appears stated age and no distress Neck: no adenopathy, no carotid bruit, no JVD, supple, symmetrical, trachea midline and thyroid not enlarged, symmetric, no tenderness/mass/nodules Resp: No wheezes, rales, or rhonchi. No tactile fremitus. No increased work of breathing. Chest wall: no tenderness Cardio: regular rate and rhythm, S1, S2 normal, no murmur, click, rub or gallop GI: soft, non-tender; bowel sounds normal; no masses,  no organomegaly Extremities: extremities normal, atraumatic, no cyanosis or edema Pulses: 2+ and symmetric Skin: Skin color, texture, turgor normal. No rashes or lesions Neurologic: Alert and oriented X 3, normal strength and tone. Normal symmetric reflexes. Normal coordination and gait  Lab Results: Recent Labs    11/21/18 1955  WBC 11.1*  HGB 12.8  HCT 37.4  PLT 243   BMET Recent Labs    11/21/18 1955  NA 137  K 3.0*  CL 108  CO2 16*  GLUCOSE 84  BUN 11  CREATININE 1.05*  CALCIUM 9.1    Studies/Results: Ct Angio Chest Pe W Or Wo Contrast  Result Date: 11/21/2018 CLINICAL DATA:  Shortness of breath and chest pain. Vomiting today after panic attack. EXAM: CT ANGIOGRAPHY CHEST WITH CONTRAST TECHNIQUE: Multidetector CT imaging of the chest was performed using the standard protocol during bolus administration of intravenous contrast. Multiplanar CT image reconstructions and MIPs were obtained to evaluate the vascular anatomy. CONTRAST:  56mL  ISOVUE-370 IOPAMIDOL (ISOVUE-370) INJECTION 76% COMPARISON:  None. FINDINGS: Cardiovascular: The pulmonary arteries are well opacified. There is no evidence of pulmonary embolism. Central pulmonary arteries are of normal caliber. The thoracic aorta is normal in caliber. The heart size is normal. No pericardial fluid. No coronary artery calcifications. Mediastinum/Nodes: There is evidence abnormal air in the posterior mediastinum beginning at the level of the thoracic inlet and tracking along the course of the esophagus inferiorly all the way to the diaphragmatic hiatus. Air outlines the distal esophagus in particular and findings are most likely due to esophageal perforation. The esophagus itself is not dilated and does not contain fluid, debris or foreign object. There is no evidence of an esophageal mass. No enlarged mediastinal, hilar, axillary or paraesophageal lymph nodes identified. Lungs/Pleura: There is no evidence of pulmonary edema, consolidation, pneumothorax, nodule or pleural fluid. Upper Abdomen: No acute abnormality. Musculoskeletal: No chest wall abnormality. No acute or significant osseous findings. Review of the MIP images confirms the above findings. IMPRESSION: Pneumomediastinum within the posterior mediastinum surrounding the esophagus with air extending from the thoracic inlet to the diaphragmatic hiatus. 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 These results were called by telephone at the time of interpretation on 11/21/2018 at 6:40 pm to Dr. Irena Reichmann , who verbally acknowledged these results. Due to the emergent findings, the patient will be transported to the [facility] Emergency Department urgently. Electronically Signed   By: Irish Lack M.D.   On: 11/21/2018 18:43   Dg Esophagus W Single Cm (sol Or Thin Ba)  Result Date: 11/21/2018 CLINICAL DATA:  23 year old female who  had been vomiting found to have  pneumomediastinum and gas surrounding the esophagus on Chest CT today. EXAM: ESOPHOGRAM/BARIUM SWALLOW TECHNIQUE: Single contrast examination was performed using water-soluble contrast and ultimately thin barium. FLUOROSCOPY TIME:  Fluoroscopy Time:  0 minutes 48 seconds Radiation Exposure Index (if provided by the fluoroscopic device): Number of Acquired Spot Images: 0 COMPARISON:  Chest CT and radiographs earlier today. FINDINGS: A single contrast study was undertaken and the patient tolerated this well and without difficulty. No obstruction to the forward flow of contrast throughout the esophagus and into the stomach. Normal esophageal course and contour. Normal gastroesophageal junction.  Negative for esophageal leak. IMPRESSION: Negative single contrast esophagram. No evidence of esophageal perforation. Electronically Signed   By: Odessa Fleming M.D.   On: 11/21/2018 23:31    Medications: I have reviewed the patient's current medications.  Assessment/Plan: Principal Problem:   Pneumomediastinum (HCC) Active Problems:   Asthma   Hypokalemia   Anxiety   Pneumomediastinum (HCC): Patient's chest pain is likely due to pneumomediastinum.  Patient has very low risk for ACS. EDP consultedd CT surgeon, Dr. Dr. Tyrone Sage. "Advises patient needs a Gastrografin barium swallow to rule out esophageal tear. If negative, patient can be admitted to hospitalist service for overnight observation and pain control with cardiothoracic consult". The Gastrografin barium swallow test is negative. Dr. Tyrone Sage has asked that the patient remain NPO for 24 hours beginning at 11:00 am on 11/22/2018. The patient will remain on stepdown for observation, as needed percocet for pain.  Asthma: No wheezing or rhonchi on auscultation. She is receiving PRN DuoNeb nebulizers and Mucinex for cough.  Hypokalemia: K=3.0  on admission. Mg=2.0. Monitor and supplement as necessary.  Anxiety: As needed ativan is available for the  patient.  DVT ppx: SCD Code Status: Full code Family Communication:  Yes, patient's  mother  at bed side Disposition Plan:  Anticipate discharge back to previous home environment Consults called:  CT surgeon, Dr. Tyrone Sage  LOS: 0 days   Zollie Clemence 11/22/2018, 2:35 PM

## 2018-11-22 NOTE — Progress Notes (Signed)
Michele Tate is a 23 y.o. female patient admitted from ED awake, alert - oriented  X 4 - no acute distress noted.  VSS - Blood pressure 127/80, pulse 96, temperature (!) 97.5 F (36.4 C), temperature source Oral, resp. rate 18, height 5\' 8"  (1.727 m), weight 102.5 kg, last menstrual period 10/29/2018, SpO2 100 %.    IV in place, occlusive dsg intact without redness.  Orientation to room, and floor completed with information packet given to patient/family.  Patient declined safety video at this time.  Admission INP armband ID verified with patient/family, and in place.   SR up x 2, fall assessment complete, with patient and family able to verbalize understanding of risk associated with falls, and verbalized understanding to call nsg before up out of bed.  Call light within reach, patient able to voice, and demonstrate understanding.  Skin, clean-dry- intact without evidence of bruising, or skin tears.   No evidence of skin break down noted on exam.     Will cont to eval and treat per MD orders.  [name], RN 11/22/2018 4:15 PM

## 2018-11-22 NOTE — ED Notes (Signed)
This RN called report, was told bed was not ready yet.

## 2018-11-23 DIAGNOSIS — F419 Anxiety disorder, unspecified: Secondary | ICD-10-CM | POA: Diagnosis not present

## 2018-11-23 DIAGNOSIS — Z791 Long term (current) use of non-steroidal anti-inflammatories (NSAID): Secondary | ICD-10-CM | POA: Diagnosis not present

## 2018-11-23 DIAGNOSIS — D72829 Elevated white blood cell count, unspecified: Secondary | ICD-10-CM | POA: Diagnosis present

## 2018-11-23 DIAGNOSIS — Z8249 Family history of ischemic heart disease and other diseases of the circulatory system: Secondary | ICD-10-CM | POA: Diagnosis not present

## 2018-11-23 DIAGNOSIS — Z818 Family history of other mental and behavioral disorders: Secondary | ICD-10-CM | POA: Diagnosis not present

## 2018-11-23 DIAGNOSIS — Z7952 Long term (current) use of systemic steroids: Secondary | ICD-10-CM | POA: Diagnosis not present

## 2018-11-23 DIAGNOSIS — J45909 Unspecified asthma, uncomplicated: Secondary | ICD-10-CM | POA: Diagnosis present

## 2018-11-23 DIAGNOSIS — Z79899 Other long term (current) drug therapy: Secondary | ICD-10-CM | POA: Diagnosis not present

## 2018-11-23 DIAGNOSIS — E876 Hypokalemia: Secondary | ICD-10-CM | POA: Diagnosis present

## 2018-11-23 DIAGNOSIS — F410 Panic disorder [episodic paroxysmal anxiety] without agoraphobia: Secondary | ICD-10-CM | POA: Diagnosis present

## 2018-11-23 DIAGNOSIS — R112 Nausea with vomiting, unspecified: Secondary | ICD-10-CM | POA: Diagnosis present

## 2018-11-23 DIAGNOSIS — J4520 Mild intermittent asthma, uncomplicated: Secondary | ICD-10-CM | POA: Diagnosis not present

## 2018-11-23 DIAGNOSIS — J982 Interstitial emphysema: Secondary | ICD-10-CM | POA: Diagnosis present

## 2018-11-23 LAB — BASIC METABOLIC PANEL
Anion gap: 12 (ref 5–15)
BUN: 8 mg/dL (ref 6–20)
CO2: 18 mmol/L — ABNORMAL LOW (ref 22–32)
Calcium: 8.6 mg/dL — ABNORMAL LOW (ref 8.9–10.3)
Chloride: 106 mmol/L (ref 98–111)
Creatinine, Ser: 0.97 mg/dL (ref 0.44–1.00)
GFR calc Af Amer: >60 mL/min (ref >60)
GFR calc non Af Amer: >60 mL/min (ref >60)
GLUCOSE: 80 mg/dL (ref 70–99)
Potassium: 3.8 mmol/L (ref 3.5–5.1)
Sodium: 136 mmol/L (ref 135–145)

## 2018-11-23 LAB — MAGNESIUM: Magnesium: 1.9 mg/dL (ref 1.7–2.4)

## 2018-11-23 LAB — HIV ANTIBODY (ROUTINE TESTING W REFLEX): HIV Screen 4th Generation wRfx: NONREACTIVE

## 2018-11-23 LAB — PHOSPHORUS: Phosphorus: 4.8 mg/dL — ABNORMAL HIGH (ref 2.5–4.6)

## 2018-11-23 MED ORDER — BUDESONIDE 0.25 MG/2ML IN SUSP
0.2500 mg | Freq: Two times a day (BID) | RESPIRATORY_TRACT | Status: DC
  Administered 2018-11-23 – 2018-11-24 (×3): 0.25 mg via RESPIRATORY_TRACT
  Filled 2018-11-23 (×5): qty 2

## 2018-11-23 MED ORDER — ARFORMOTEROL TARTRATE 15 MCG/2ML IN NEBU
15.0000 ug | INHALATION_SOLUTION | Freq: Two times a day (BID) | RESPIRATORY_TRACT | Status: DC
  Administered 2018-11-23 – 2018-11-24 (×3): 15 ug via RESPIRATORY_TRACT
  Filled 2018-11-23 (×5): qty 2

## 2018-11-23 MED ORDER — ALUM & MAG HYDROXIDE-SIMETH 200-200-20 MG/5ML PO SUSP
30.0000 mL | ORAL | Status: DC | PRN
  Administered 2018-11-23: 30 mL via ORAL
  Filled 2018-11-23: qty 30

## 2018-11-23 MED ORDER — PANTOPRAZOLE SODIUM 40 MG PO TBEC
40.0000 mg | DELAYED_RELEASE_TABLET | Freq: Every day | ORAL | Status: DC
  Administered 2018-11-23 – 2018-11-24 (×2): 40 mg via ORAL
  Filled 2018-11-23 (×2): qty 1

## 2018-11-23 MED ORDER — CLONAZEPAM 0.125 MG PO TBDP
0.5000 mg | ORAL_TABLET | Freq: Two times a day (BID) | ORAL | Status: DC | PRN
  Administered 2018-11-23: 0.5 mg via ORAL
  Filled 2018-11-23 (×2): qty 4

## 2018-11-23 MED ORDER — BENZONATATE 100 MG PO CAPS
200.0000 mg | ORAL_CAPSULE | Freq: Three times a day (TID) | ORAL | Status: DC | PRN
  Filled 2018-11-23: qty 2

## 2018-11-23 NOTE — Progress Notes (Signed)
Patient c/o IV hurting. IV site assessed by this RN, Audie Clear Psychologist, occupational), and IV team RN. No signs of infiltration or reaction. This RN changed IV dressing and attempted to wrap IV site for comfort. Patient refused. Charge RN and IV team RN offered to attempt to get another IV site for patient. Patient refused. This Water engineer have educated patient of importance of IV access and IVF. Patient has repeatedly stated that she would tolerate IV, then complains again later. Will continue to monitor.

## 2018-11-23 NOTE — Progress Notes (Signed)
Patient refusing IV at this time MD aware.

## 2018-11-23 NOTE — Progress Notes (Addendum)
[address]       [place] [address]             [phone]         Subjective: conts to cough up "frothy" stuff and has some dysphagia  Objective: Vital signs in last 24 hours: Temp:  [97.5 F (36.4 C)-98 F (36.7 C)] 98 F (36.7 C) (02/20 0525) Pulse Rate:  [57-96] 57 (02/20 0525) Cardiac Rhythm: Normal sinus rhythm (02/19 1936) Resp:  [14-21] 16 (02/19 2150) BP: (102-135)/(57-93) 102/62 (02/20 0525) SpO2:  [98 %-100 %] 100 % (02/20 0525) Weight:  [102.5 kg] 102.5 kg (02/19 1549)  Hemodynamic parameters for last 24 hours:    Intake/Output from previous day: 02/19 0701 - 02/20 0700 In: 3771.8 [P.O.:240; I.V.:3531.8] Out: -  Intake/Output this shift: No intake/output data recorded.  General appearance: distracted, fatigued and no distress Heart: regular rate and rhythm Lungs: clear to auscultation bilaterally Abdomen: benign  Lab Results: Recent Labs    11/21/18 1955  WBC 11.1*  HGB 12.8  HCT 37.4  PLT 243   BMET:  Recent Labs    11/21/18 1955  NA 137  K 3.0*  CL 108  CO2 16*  GLUCOSE 84  BUN 11  CREATININE 1.05*  CALCIUM 9.1    PT/INR:  Recent Labs    11/22/18 1554  LABPROT 13.8  INR 1.07   ABG No results found for: PHART, HCO3, TCO2, ACIDBASEDEF, O2SAT CBG (last 3)  No results for input(s): GLUCAP in the last 72 hours.  Meds Scheduled Meds: Continuous Infusions: . lactated ringers Stopped (11/23/18 0204)   PRN Meds:.acetaminophen, dextromethorphan-guaiFENesin, ipratropium-albuterol, LORazepam, ondansetron **OR** ondansetron (ZOFRAN) IV, senna-docusate  Xrays Ct Angio Chest Pe W Or Wo Contrast  Result Date: 11/21/2018 CLINICAL DATA:  Shortness of breath and chest pain. Vomiting today after panic attack. EXAM: CT ANGIOGRAPHY CHEST WITH CONTRAST TECHNIQUE: Multidetector CT imaging of the chest was performed using the standard protocol during bolus administration of intravenous contrast. Multiplanar CT image  reconstructions and MIPs were obtained to evaluate the vascular anatomy. CONTRAST:  66mL ISOVUE-370 IOPAMIDOL (ISOVUE-370) INJECTION 76% COMPARISON:  None. FINDINGS: Cardiovascular: The pulmonary arteries are well opacified. There is no evidence of pulmonary embolism. Central pulmonary arteries are of normal caliber. The thoracic aorta is normal in caliber. The heart size is normal. No pericardial fluid. No coronary artery calcifications. Mediastinum/Nodes: There is evidence abnormal air in the posterior mediastinum beginning at the level of the thoracic inlet and tracking along the course of the esophagus inferiorly all the way to the diaphragmatic hiatus. Air outlines the distal esophagus in particular and findings are most likely due to esophageal perforation. The esophagus itself is not dilated and does not contain fluid, debris or foreign object. There is no evidence of an esophageal mass. No enlarged mediastinal, hilar, axillary or paraesophageal lymph nodes identified. Lungs/Pleura: There is no evidence of pulmonary edema, consolidation, pneumothorax, nodule or pleural fluid. Upper Abdomen: No acute abnormality. Musculoskeletal: No chest wall abnormality. No acute or significant osseous findings. Review of the MIP images confirms the above findings. IMPRESSION: Pneumomediastinum within the posterior mediastinum surrounding the esophagus with air extending from the thoracic inlet to the diaphragmatic hiatus. 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 These results were called by telephone at the time of interpretation on 11/21/2018 at 6:40 pm to Dr. Irena Reichmann , who verbally acknowledged these results. Due to the emergent findings, the  patient will be transported to the [facility] Emergency Department urgently. Electronically Signed   By: Irish Lack M.D.   On: 11/21/2018 18:43   Dg Esophagus W Single Cm (sol Or Thin Ba)  Result Date:  11/21/2018 CLINICAL DATA:  23 year old female who had been vomiting found to have pneumomediastinum and gas surrounding the esophagus on Chest CT today. EXAM: ESOPHOGRAM/BARIUM SWALLOW TECHNIQUE: Single contrast examination was performed using water-soluble contrast and ultimately thin barium. FLUOROSCOPY TIME:  Fluoroscopy Time:  0 minutes 48 seconds Radiation Exposure Index (if provided by the fluoroscopic device): Number of Acquired Spot Images: 0 COMPARISON:  Chest CT and radiographs earlier today. FINDINGS: A single contrast study was undertaken and the patient tolerated this well and without difficulty. No obstruction to the forward flow of contrast throughout the esophagus and into the stomach. Normal esophageal course and contour. Normal gastroesophageal junction.  Negative for esophageal leak. IMPRESSION: Negative single contrast esophagram. No evidence of esophageal perforation. Electronically Signed   By: Odessa Fleming M.D.   On: 11/21/2018 23:31    Assessment/Plan:  1 remains stable but symptomatic cough with some dysphagia- we can trial clear liquids but told her too go slow and if makes nauseated our increases cough to stop    LOS: 0 days    Rowe Clack PA-C 11/23/2018  Pager [phone]  I have seen and examined Lonell Grandchild and agree with the above assessment  and plan.  Delight Ovens MD Beeper [phone] Office [phone] 11/23/2018 2:00 PM

## 2018-11-23 NOTE — Progress Notes (Signed)
Spoke with Gershon Crane PA in regards to patient requesting. Solid Diet. Per PA keep patient on clear liquids due. Patient educated on the importance of staying on clear liquids.

## 2018-11-23 NOTE — Progress Notes (Signed)
Informed Melinda in telemetry that patient is being moved from room 5w17 to 5w27 and that this RN will remain the nurse.

## 2018-11-23 NOTE — Progress Notes (Signed)
Patient call this RN into room. Patient stated that she coughing up frothy sputum. This RN observed cup that she stated she was coughing this sputum into. This RN noted approximately 120 ml of clear liquid with frothy surface. Patient stated that there were only two ice cubes in cup prior. Upon asking description, patient stated that she was unsure if she was vomiting then coughing or coughing then vomiting. Patient c/o SOB. Patient had asked for pulse ox machine to ease anxiety since SpO2 has remained WDL. Continuous SpO2 added to tele monitor. Patient has removed this from her finger. Patient breathing slow and deep. Patient states that it feels like she needs to burp and that she feels like the foam is getting stuck in her throat. This RN offered breathing tx. Patient stated breathing tx makes her more anxious. This RN offered Mucinex for cough. Patient stated that she is allergic to Mucinex, but can't remember reaction. This RN offered Zofran. Patient refused, stating that she does not believe that she's vomiting. This RN gave Ativan per PRN orders. Patient was reluctant at first, but did agree. Will continue to monitor.

## 2018-11-23 NOTE — Progress Notes (Signed)
PROGRESS NOTE        PATIENT DETAILS Name: Michele Tate Age: 23 y.o. Sex: female Date of Birth: 02/25/1996 Admit Date: 11/21/2018 Admitting Physician Lorretta HarpXilin Niu, MD ONG:[id number]:Collins, Annabelle Harmanana, DO  Brief Narrative: Patient is a 23 y.o. female with history of asthma-presented with worsening cough, shortness of breath for the past few days, she was found to have pneumomediastinum on CT angiogram of the chest.  See below for further details  Subjective: No wheezing-no chest pain-lying comfortably in bed.  Slightly anxious.  Claims that she still has a cough.  Assessment/Plan: Pneumomediastinum: May have had transient mucosal disruption of her airway given history of recurrent cough.  No evidence of esophageal leak on Gastrografin study.  Cardiothoracic surgery following, diet being slowly advanced.  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  Anxiety: Reassurance provided-continue as needed benzodiazepines.  DVT Prophylaxis: SCD's  Code Status: Full code   Family Communication: Mom at bedside  Disposition Plan: Remain inpatient-can downgrade to telemetry  Antimicrobial agents: Anti-infectives (From admission, onward)   Start     Dose/Rate Route Frequency Ordered Stop   11/21/18 2215  piperacillin-tazobactam (ZOSYN) IVPB 3.375 g  Status:  Discontinued     3.375 g 100 mL/hr over 30 Minutes Intravenous  Once 11/21/18 2208 11/21/18 2213      Procedures: None  CONSULTS:  CTVS  Time spent: 25- minutes-Greater than 50% of this time was spent in counseling, explanation of diagnosis, planning of further management, and coordination of care.  MEDICATIONS: Scheduled Meds: Continuous Infusions: . lactated ringers Stopped (11/23/18 0204)   PRN Meds:.acetaminophen, benzonatate, clonazepam, ipratropium-albuterol, ondansetron **OR** ondansetron (ZOFRAN) IV, senna-docusate   PHYSICAL EXAM: Vital signs: Vitals:   11/22/18 1549 11/22/18 1758 11/22/18 2150 11/23/18 0525  BP: 127/80 113/80 117/69 102/62  Pulse: 96  65 (!) 57  Resp: 18 18 16    Temp: (!) 97.5 F (36.4 C)  97.7 F (36.5 C) 98 F (36.7 C)  TempSrc: Oral  Oral Oral  SpO2: 100% 100% 100% 100%  Weight: 102.5 kg     Height: 5\' 8"  (1.727 m)      Filed Weights   11/22/18 1549  Weight: 102.5 kg   Body mass index is 34.36 kg/m.   General appearance :Awake, alert, not in any distress. Speech Clear. Not toxic Looking Eyes:, pupils equally reactive to light and accomodation,no scleral icterus.Pink conjunctiva HEENT: Atraumatic and Normocephalic Neck: supple, no JVD. No cervical lymphadenopathy. No thyromegaly Resp:Good air entry bilaterally, no added sounds  CVS: S1 S2 regular, no murmurs.  GI: Bowel sounds present, Non tender and not distended with no gaurding, rigidity or rebound.No organomegaly Extremities: B/L Lower Ext shows no edema, both legs are warm to touch Neurology:  speech clear,Non focal, sensation is grossly intact. Psychiatric: Normal judgment and insight. Alert and oriented x 3. Normal mood. Musculoskeletal:No digital cyanosis Skin:No Rash, warm and dry Wounds:N/A  I have personally reviewed following labs and imaging studies  LABORATORY DATA: CBC: Recent Labs  Lab 11/21/18 1955  WBC 11.1*  NEUTROABS 6.8  HGB 12.8  HCT 37.4  MCV 94.2  PLT 243    Basic Metabolic Panel: Recent Labs  Lab 11/21/18 1955 11/23/18 0742  NA 137 136  K 3.0* 3.8  CL 108 106  CO2 16* 18*  GLUCOSE 84 80  BUN 11 8  CREATININE 1.05* 0.97  CALCIUM 9.1 8.6*  MG 2.0 1.9  PHOS 1.1* 4.8*    GFR: Estimated Creatinine Clearance: 114.9 mL/min (by C-G formula based on SCr of 0.97 mg/dL).  Liver Function Tests: Recent Labs  Lab 11/21/18 1955  AST 20  ALT 22  ALKPHOS 62  BILITOT 1.1  PROT 7.1  ALBUMIN 3.9   No results for input(s): LIPASE, AMYLASE in the last 168 hours. No results for input(s): AMMONIA in the last 168  hours.  Coagulation Profile: Recent Labs  Lab 11/22/18 1554  INR 1.07    Cardiac Enzymes: No results for input(s): CKTOTAL, CKMB, CKMBINDEX, TROPONINI in the last 168 hours.  BNP (last 3 results) No results for input(s): PROBNP in the last 8760 hours.  HbA1C: No results for input(s): HGBA1C in the last 72 hours.  CBG: No results for input(s): GLUCAP in the last 168 hours.  Lipid Profile: No results for input(s): CHOL, HDL, LDLCALC, TRIG, CHOLHDL, LDLDIRECT in the last 72 hours.  Thyroid Function Tests: Recent Labs    11/21/18 1955  TSH 0.971    Anemia Panel: No results for input(s): VITAMINB12, FOLATE, FERRITIN, TIBC, IRON, RETICCTPCT in the last 72 hours.  Urine analysis: No results found for: COLORURINE, APPEARANCEUR, LABSPEC, PHURINE, GLUCOSEU, HGBUR, BILIRUBINUR, KETONESUR, PROTEINUR, UROBILINOGEN, NITRITE, LEUKOCYTESUR  Sepsis Labs: Lactic Acid, Venous    Component Value Date/Time   LATICACIDVEN 1.2 11/22/2018 0022    MICROBIOLOGY: Recent Results (from the past 240 hour(s))  Culture, blood (routine x 2)     Status: None (Preliminary result)   Collection Time: 11/21/18  7:56 PM  Result Value Ref Range Status   Specimen Description BLOOD LEFT ANTECUBITAL  Final   Special Requests   Final    BOTTLES DRAWN AEROBIC AND ANAEROBIC Blood Culture results may not be optimal due to an inadequate volume of blood received in culture bottles   Culture   Final    NO GROWTH 2 DAYS Performed at [facility], [address] [place], [place] [address]    Report Status PENDING  Incomplete    RADIOLOGY STUDIES/RESULTS: Dg Chest 2 View  Result Date: 11/03/2018 CLINICAL DATA:  Wheezing and dry cough today.  Asthma. EXAM: CHEST - 2 VIEW COMPARISON:  None. FINDINGS: The lungs are clear. Heart size is normal. There is no pneumothorax or pleural fluid. No bony abnormality. IMPRESSION: Normal chest. Electronically Signed   By: Drusilla Kanner M.D.   On: 11/03/2018 16:47    Ct Angio Chest Pe W Or Wo Contrast  Result Date: 11/21/2018 CLINICAL DATA:  Shortness of breath and chest pain. Vomiting today after panic attack. EXAM: CT ANGIOGRAPHY CHEST WITH CONTRAST TECHNIQUE: Multidetector CT imaging of the chest was performed using the standard protocol during bolus administration of intravenous contrast. Multiplanar CT image reconstructions and MIPs were obtained to evaluate the vascular anatomy. CONTRAST:  31mL ISOVUE-370 IOPAMIDOL (ISOVUE-370) INJECTION 76% COMPARISON:  None. FINDINGS: Cardiovascular: The pulmonary arteries are well opacified. There is no evidence of pulmonary embolism. Central pulmonary arteries are of normal caliber. The thoracic aorta is normal in caliber. The heart size is normal. No pericardial fluid. No coronary artery calcifications. Mediastinum/Nodes: There is evidence abnormal air in the posterior mediastinum beginning at the level of the thoracic inlet and tracking along the course of the esophagus inferiorly all the way to the diaphragmatic hiatus. Air outlines the distal esophagus in particular and findings are most likely due to esophageal perforation. The esophagus itself is not dilated and does not contain  fluid, debris or foreign object. There is no evidence of an esophageal mass. No enlarged mediastinal, hilar, axillary or paraesophageal lymph nodes identified. Lungs/Pleura: There is no evidence of pulmonary edema, consolidation, pneumothorax, nodule or pleural fluid. Upper Abdomen: No acute abnormality. Musculoskeletal: No chest wall abnormality. No acute or significant osseous findings. Review of the MIP images confirms the above findings. IMPRESSION: Pneumomediastinum within the posterior mediastinum surrounding the esophagus with air extending from the thoracic inlet to the diaphragmatic hiatus. 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 These results were called by telephone at the time  of interpretation on 11/21/2018 at 6:40 pm to Dr. Irena Reichmann , who verbally acknowledged these results. Due to the emergent findings, the patient will be transported to the [facility] Emergency Department urgently. Electronically Signed   By: Irish Lack M.D.   On: 11/21/2018 18:43   Dg Esophagus W Single Cm (sol Or Thin Ba)  Result Date: 11/21/2018 CLINICAL DATA:  23 year old female who had been vomiting found to have pneumomediastinum and gas surrounding the esophagus on Chest CT today. EXAM: ESOPHOGRAM/BARIUM SWALLOW TECHNIQUE: Single contrast examination was performed using water-soluble contrast and ultimately thin barium. FLUOROSCOPY TIME:  Fluoroscopy Time:  0 minutes 48 seconds Radiation Exposure Index (if provided by the fluoroscopic device): Number of Acquired Spot Images: 0 COMPARISON:  Chest CT and radiographs earlier today. FINDINGS: A single contrast study was undertaken and the patient tolerated this well and without difficulty. No obstruction to the forward flow of contrast throughout the esophagus and into the stomach. Normal esophageal course and contour. Normal gastroesophageal junction.  Negative for esophageal leak. IMPRESSION: Negative single contrast esophagram. No evidence of esophageal perforation. Electronically Signed   By: Odessa Fleming M.D.   On: 11/21/2018 23:31     LOS: 0 days   Jeoffrey Massed, MD  [facility]  If 7PM-7AM, please contact night-coverage  Please page via [email or web address]  Go to [email or web address] and use [place]'s universal password to access. If you do not have the password, please contact the hospital operator.  Locate the [facility] provider you are looking for under [facility] and page to a number that you can be directly reached. If you still have difficulty reaching the provider, please page the [facility] (Director on Call) for the Hospitalists listed on amion for assistance.  11/23/2018, 12:03 PM

## 2018-11-23 NOTE — Progress Notes (Signed)
Informed Harriett Sine in telemetry that patient is Coming of telemetry box to take a shower.

## 2018-11-23 NOTE — Progress Notes (Signed)
Patient requesting to eat solid food. Patient reeducated on why the doctors want her on a clear liquid diet.

## 2018-11-23 NOTE — Progress Notes (Signed)
Informed Dr. Jerral Ralph that patient is refusing an IV at this time. MD aware no new orders received.

## 2018-11-23 NOTE — Progress Notes (Signed)
Patient has been resting since Ativan given. IV was beeping d/t patient bending her arm. Patient wanted IV out d/t itching. Patient refusing another IV site. This RN reminded patient about need for IVF. Patient refusing IVF and IV access. Audie Clear, Consulting civil engineer notified. IV removed with no difficulty. Schorr, NP notified via text page. Will continue to monitor.

## 2018-11-24 MED ORDER — GLYCOPYRROLATE 0.2 MG/ML IJ SOLN
0.1000 mg | Freq: Once | INTRAMUSCULAR | Status: DC
  Filled 2018-11-24: qty 1

## 2018-11-24 NOTE — Plan of Care (Signed)
  Problem: Education: Goal: Knowledge of General Education information will improve Description: Including pain rating scale, medication(s)/side effects and non-pharmacologic comfort measures Outcome: Progressing   Problem: Health Behavior/Discharge Planning: Goal: Ability to manage health-related needs will improve Outcome: Progressing   Problem: Clinical Measurements: Goal: Ability to maintain clinical measurements within normal limits will improve Outcome: Progressing Goal: Will remain free from infection Outcome: Progressing Goal: Diagnostic test results will improve Outcome: Progressing Goal: Respiratory complications will improve Outcome: Progressing Goal: Cardiovascular complication will be avoided Outcome: Progressing   Problem: Activity: Goal: Risk for activity intolerance will decrease Outcome: Progressing   Problem: Nutrition: Goal: Adequate nutrition will be maintained Outcome: Progressing   Problem: Coping: Goal: Level of anxiety will decrease Outcome: Progressing   Problem: Elimination: Goal: Will not experience complications related to bowel motility Outcome: Progressing Goal: Will not experience complications related to urinary retention Outcome: Progressing   Problem: Pain Managment: Goal: General experience of comfort will improve Outcome: Progressing   Problem: Safety: Goal: Ability to remain free from injury will improve Outcome: Progressing   Problem: Skin Integrity: Goal: Risk for impaired skin integrity will decrease Outcome: Progressing   Problem: Education: Goal: Ability to state activities that reduce stress will improve Outcome: Progressing   Problem: Coping: Goal: Ability to identify and develop effective coping behavior will improve Outcome: Progressing   Problem: Self-Concept: Goal: Ability to identify factors that promote anxiety will improve Outcome: Progressing Goal: Level of anxiety will decrease Outcome:  Progressing Goal: Ability to modify response to factors that promote anxiety will improve Outcome: Progressing   

## 2018-11-24 NOTE — Progress Notes (Addendum)
      [address]       [place]ichele Tate [address]             [phone]         Subjective: Similar dysphagia and coughing sx  Objective: Vital signs in last 24 hours: Temp:  [98.2 F (36.8 C)] 98.2 F (36.8 C) (02/20 1420) Pulse Rate:  [68] 68 (02/20 1420) Cardiac Rhythm: Normal sinus rhythm (02/20 2112) Resp:  [16] 16 (02/20 1420) BP: (118)/(67) 118/67 (02/20 1420) SpO2:  [99 %-100 %] 100 % (02/20 2056)  Hemodynamic parameters for last 24 hours:    Intake/Output from previous day: 02/20 0701 - 02/21 0700 In: 240 [P.O.:240] Out: -  Intake/Output this shift: No intake/output data recorded.  General appearance: alert, cooperative and no distress Heart: regular rate and rhythm Lungs: clear to auscultation bilaterally Abdomen: benign Extremities: no calf tenderness or edema Wound: N/A  Lab Results: Recent Labs    11/21/18 1955  WBC 11.1*  HGB 12.8  HCT 37.4  PLT 243   BMET:  Recent Labs    11/21/18 1955 11/23/18 0742  NA 137 136  K 3.0* 3.8  CL 108 106  CO2 16* 18*  GLUCOSE 84 80  BUN 11 8  CREATININE 1.05* 0.97  CALCIUM 9.1 8.6*    PT/INR:  Recent Labs    11/22/18 1554  LABPROT 13.8  INR 1.07   ABG No results found for: PHART, HCO3, TCO2, ACIDBASEDEF, O2SAT CBG (last 3)  No results for input(s): GLUCAP in the last 72 hours.  Meds Scheduled Meds: . arformoterol  15 mcg Nebulization BID  . budesonide (PULMICORT) nebulizer solution  0.25 mg Nebulization BID  . pantoprazole  40 mg Oral Q1200   Continuous Infusions: . lactated ringers Stopped (11/23/18 0204)   PRN Meds:.acetaminophen, alum & mag hydroxide-simeth, benzonatate, clonazepam, ipratropium-albuterol, ondansetron **OR** ondansetron (ZOFRAN) IV, senna-docusate  Xrays No results found.  Assessment/Plan:  1 clinically stable , tol clear liquids, will d/w surgeon to see if we can advance diet. We don't have a good explanation of sx. May need to consider outpatient  GI eval for dysmotility type syndrome of esophagus . Still unclear what role "asthma" plays in her sx. Anxiety is a big role in all of this    LOS: 1 day    Rowe Clack [place] 11/24/2018 Pager [phone] Advance diet slowly- soft , will need pulmonary and gi evaluation poss as outpatient  I have seen and examined Michele Tate and agree with the above assessment  and plan.  Delight Ovens MD [place] [phone] Office [phone] 11/24/2018 8:42 AM

## 2018-11-24 NOTE — Progress Notes (Signed)
Patient complains of air pressure and discomfort to trachea and throat, offered medication intervention for relief.  Patient refused medication. Education provided.  Will continue to monitor.

## 2018-11-24 NOTE — Progress Notes (Signed)
PROGRESS NOTE        PATIENT DETAILS Name: Michele Tate Age: 23 y.o. Sex: female Date of Birth: Feb 18, 1996 Admit Date: 11/21/2018 Admitting Physician Lorretta Harp, MD CWC:[id number], Annabelle Harman, DO  Brief Narrative: Patient is a 23 y.o. female with history of asthma-presented with worsening cough, shortness of breath for the past few days, she was found to have pneumomediastinum on CT angiogram of the chest.  See below for further details  Subjective: Tolerating clear liquids-complains of some for the sputum.  But no shortness of breath.  Assessment/Plan: Pneumomediastinum: May have had transient mucosal disruption of her airway given history of recurrent cough.  No evidence of esophageal leak on Gastrografin study.  Cardiothoracic surgery following, tolerating clear liquids-has been advanced to a mechanical soft diet.  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  Anxiety: Reassurance provided-continue as needed benzodiazepines.  DVT Prophylaxis: SCD's  Code Status: Full code   Family Communication: Mom at bedside  Disposition Plan: Remain inpatient-can downgrade to MedSurg  Antimicrobial agents: Anti-infectives (From admission, onward)   Start     Dose/Rate Route Frequency Ordered Stop   11/21/18 2215  piperacillin-tazobactam (ZOSYN) IVPB 3.375 g  Status:  Discontinued     3.375 g 100 mL/hr over 30 Minutes Intravenous  Once 11/21/18 2208 11/21/18 2213      Procedures: None  CONSULTS:  CTVS  Time spent: 15- minutes-Greater than 50% of this time was spent in counseling, explanation of diagnosis, planning of further management, and coordination of care.  MEDICATIONS: Scheduled Meds: . arformoterol  15 mcg Nebulization BID  . budesonide (PULMICORT) nebulizer solution  0.25 mg Nebulization BID  . pantoprazole  40 mg Oral Q1200   Continuous Infusions: . lactated ringers Stopped (11/23/18 0204)   PRN  Meds:.acetaminophen, alum & mag hydroxide-simeth, benzonatate, clonazepam, ipratropium-albuterol, ondansetron **OR** ondansetron (ZOFRAN) IV, senna-docusate   PHYSICAL EXAM: Vital signs: Vitals:   11/23/18 0525 11/23/18 1239 11/23/18 1420 11/23/18 2056  BP: 102/62  118/67   Pulse: (!) 57  68   Resp:   16   Temp: 98 F (36.7 C)  98.2 F (36.8 C)   TempSrc: Oral  Oral   SpO2: 100% 99% 100% 100%  Weight:      Height:       Filed Weights   11/22/18 1549  Weight: 102.5 kg   Body mass index is 34.36 kg/m.   General appearance :Awake, alert, not in any distress. Speech Clear. Not toxic Looking Eyes:, pupils equally reactive to light and accomodation,no scleral icterus.Pink conjunctiva HEENT: Atraumatic and Normocephalic Neck: supple, no JVD. No cervical lymphadenopathy. No thyromegaly Resp:Good air entry bilaterally, no added sounds  CVS: S1 S2 regular, no murmurs.  GI: Bowel sounds present, Non tender and not distended with no gaurding, rigidity or rebound.No organomegaly Extremities: B/L Lower Ext shows no edema, both legs are warm to touch Neurology:  speech clear,Non focal, sensation is grossly intact. Psychiatric: Normal judgment and insight. Alert and oriented x 3. Normal mood. Musculoskeletal:No digital cyanosis Skin:No Rash, warm and dry Wounds:N/A  I have personally reviewed following labs and imaging studies  LABORATORY DATA: CBC: Recent Labs  Lab 11/21/18 1955  WBC 11.1*  NEUTROABS 6.8  HGB 12.8  HCT 37.4  MCV 94.2  PLT 243    Basic Metabolic Panel: Recent Labs  Lab 11/21/18 1955 11/23/18 0742  NA  137 136  K 3.0* 3.8  CL 108 106  CO2 16* 18*  GLUCOSE 84 80  BUN 11 8  CREATININE 1.05* 0.97  CALCIUM 9.1 8.6*  MG 2.0 1.9  PHOS 1.1* 4.8*    GFR: Estimated Creatinine Clearance: 114.9 mL/min (by C-G formula based on SCr of 0.97 mg/dL).  Liver Function Tests: Recent Labs  Lab 11/21/18 1955  AST 20  ALT 22  ALKPHOS 62  BILITOT 1.1  PROT  7.1  ALBUMIN 3.9   No results for input(s): LIPASE, AMYLASE in the last 168 hours. No results for input(s): AMMONIA in the last 168 hours.  Coagulation Profile: Recent Labs  Lab 11/22/18 1554  INR 1.07    Cardiac Enzymes: No results for input(s): CKTOTAL, CKMB, CKMBINDEX, TROPONINI in the last 168 hours.  BNP (last 3 results) No results for input(s): PROBNP in the last 8760 hours.  HbA1C: No results for input(s): HGBA1C in the last 72 hours.  CBG: No results for input(s): GLUCAP in the last 168 hours.  Lipid Profile: No results for input(s): CHOL, HDL, LDLCALC, TRIG, CHOLHDL, LDLDIRECT in the last 72 hours.  Thyroid Function Tests: Recent Labs    11/21/18 1955  TSH 0.971    Anemia Panel: No results for input(s): VITAMINB12, FOLATE, FERRITIN, TIBC, IRON, RETICCTPCT in the last 72 hours.  Urine analysis: No results found for: COLORURINE, APPEARANCEUR, LABSPEC, PHURINE, GLUCOSEU, HGBUR, BILIRUBINUR, KETONESUR, PROTEINUR, UROBILINOGEN, NITRITE, LEUKOCYTESUR  Sepsis Labs: Lactic Acid, Venous    Component Value Date/Time   LATICACIDVEN 1.2 11/22/2018 0022    MICROBIOLOGY: Recent Results (from the past 240 hour(s))  Culture, blood (routine x 2)     Status: None (Preliminary result)   Collection Time: 11/21/18  7:56 PM  Result Value Ref Range Status   Specimen Description BLOOD LEFT ANTECUBITAL  Final   Special Requests   Final    BOTTLES DRAWN AEROBIC AND ANAEROBIC Blood Culture results may not be optimal due to an inadequate volume of blood received in culture bottles   Culture NO GROWTH 3 DAYS  Final   Report Status PENDING  Incomplete    RADIOLOGY STUDIES/RESULTS: Dg Chest 2 View  Result Date: 11/03/2018 CLINICAL DATA:  Wheezing and dry cough today.  Asthma. EXAM: CHEST - 2 VIEW COMPARISON:  None. FINDINGS: The lungs are clear. Heart size is normal. There is no pneumothorax or pleural fluid. No bony abnormality. IMPRESSION: Normal chest. Electronically  Signed   By: Drusilla Kannerhomas  Dalessio M.D.   On: 11/03/2018 16:47   Ct Angio Chest Pe W Or Wo Contrast  Result Date: 11/21/2018 CLINICAL DATA:  Shortness of breath and chest pain. Vomiting today after panic attack. EXAM: CT ANGIOGRAPHY CHEST WITH CONTRAST TECHNIQUE: Multidetector CT imaging of the chest was performed using the standard protocol during bolus administration of intravenous contrast. Multiplanar CT image reconstructions and MIPs were obtained to evaluate the vascular anatomy. CONTRAST:  50mL ISOVUE-370 IOPAMIDOL (ISOVUE-370) INJECTION 76% COMPARISON:  None. FINDINGS: Cardiovascular: The pulmonary arteries are well opacified. There is no evidence of pulmonary embolism. Central pulmonary arteries are of normal caliber. The thoracic aorta is normal in caliber. The heart size is normal. No pericardial fluid. No coronary artery calcifications. Mediastinum/Nodes: There is evidence abnormal air in the posterior mediastinum beginning at the level of the thoracic inlet and tracking along the course of the esophagus inferiorly all the way to the diaphragmatic hiatus. Air outlines the distal esophagus in particular and findings are most likely due to esophageal perforation. The  esophagus itself is not dilated and does not contain fluid, debris or foreign object. There is no evidence of an esophageal mass. No enlarged mediastinal, hilar, axillary or paraesophageal lymph nodes identified. Lungs/Pleura: There is no evidence of pulmonary edema, consolidation, pneumothorax, nodule or pleural fluid. Upper Abdomen: No acute abnormality. Musculoskeletal: No chest wall abnormality. No acute or significant osseous findings. Review of the MIP images confirms the above findings. IMPRESSION: Pneumomediastinum within the posterior mediastinum surrounding the esophagus with air extending from the thoracic inlet to the diaphragmatic hiatus. 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 These results were called by telephone at the time of interpretation on 11/21/2018 at 6:40 pm to Dr. Irena Reichmann , who verbally acknowledged these results. Due to the emergent findings, the patient will be transported to the [facility] Emergency Department urgently. Electronically Signed   By: Irish Lack M.D.   On: 11/21/2018 18:43   Dg Esophagus W Single Cm (sol Or Thin Ba)  Result Date: 11/21/2018 CLINICAL DATA:  23 year old female who had been vomiting found to have pneumomediastinum and gas surrounding the esophagus on Chest CT today. EXAM: ESOPHOGRAM/BARIUM SWALLOW TECHNIQUE: Single contrast examination was performed using water-soluble contrast and ultimately thin barium. FLUOROSCOPY TIME:  Fluoroscopy Time:  0 minutes 48 seconds Radiation Exposure Index (if provided by the fluoroscopic device): Number of Acquired Spot Images: 0 COMPARISON:  Chest CT and radiographs earlier today. FINDINGS: A single contrast study was undertaken and the patient tolerated this well and without difficulty. No obstruction to the forward flow of contrast throughout the esophagus and into the stomach. Normal esophageal course and contour. Normal gastroesophageal junction.  Negative for esophageal leak. IMPRESSION: Negative single contrast esophagram. No evidence of esophageal perforation. Electronically Signed   By: Odessa Fleming M.D.   On: 11/21/2018 23:31     LOS: 1 day   Jeoffrey Massed, MD  [facility]  If 7PM-7AM, please contact night-coverage  Please page via [email or web address]  Go to [email or web address] and use [facility] universal password to access. If you do not have the password, please contact the hospital operator.  Locate the [facility] provider you are looking for under [facility] and page to a number that you can be directly reached. If you still have difficulty reaching the provider, please page the [facility] (Director on Call) for the Hospitalists listed on amion for assistance.  11/24/2018,  11:01 AM

## 2018-11-25 MED ORDER — BENZONATATE 200 MG PO CAPS: 200.0000 mg | ORAL_CAPSULE | Freq: Three times a day (TID) | ORAL | 0 refills | Status: AC | PRN

## 2018-11-25 MED ORDER — PANTOPRAZOLE SODIUM 40 MG PO TBEC: 40.0000 mg | DELAYED_RELEASE_TABLET | Freq: Every day | ORAL | 0 refills | Status: AC

## 2018-11-25 MED ORDER — FLUTICASONE-SALMETEROL 250-50 MCG/DOSE IN AEPB: 1.0000 | INHALATION_SPRAY | Freq: Two times a day (BID) | RESPIRATORY_TRACT | 0 refills | Status: AC

## 2018-11-25 MED ORDER — ALBUTEROL SULFATE 108 (90 BASE) MCG/ACT IN AEPB: 2.0000 | INHALATION_SPRAY | RESPIRATORY_TRACT | 0 refills | Status: AC | PRN

## 2018-11-25 NOTE — Discharge Summary (Addendum)
PATIENT DETAILS Name: Michele Tate Age: 23 y.o. Sex: female Date of Birth: 06/13/1996 MRN: [id number]. Admitting Physician: Lorretta Harp, MD WUJ:[id number], Annabelle Harman, DO  Admit Date: 11/21/2018 Discharge date: 11/25/2018  Recommendations for Outpatient Follow-up:  1. Follow up with PCP in 1-2 weeks 2. Please obtain BMP/CBC in one week 3. Consider outpatient GI and pulmonary evaluation if patient continues to have dyspepsia-like symptoms  Admitted From:  Home  Disposition: Home  Home Health: No  Equipment/Devices: None  Discharge Condition: Stable  CODE STATUS: FULL CODE  Diet recommendation:  Regular  Brief Summary: See H&P, Labs, Consult and Test reports for all details in brief, Patient is a 23 y.o. female with history of asthma-presented with worsening cough, shortness of breath for the past few days, she was found to have pneumomediastinum on CT angiogram of the chest.  See below for further details  Brief Hospital Course: Pneumomediastinum: May have had transient mucosal disruption of her airway or esophagus given history of recurrent cough.  No evidence of esophageal leak on Gastrografin study.  Cardiothoracic surgery followed closely-initially kept n.p.o. but started on clear liquids-by day of discharge tolerating soft diet.  She denies any dysphagia stable for discharge-cardiothoracic surgery recommending outpatient follow-up with GI and pulmonology if she continues to have dyspeptic symptoms.  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  Continues to complain of some mild cough with mucus-has been empirically started on a PPI.  Anxiety:  Stable-was treated with as needed bronchodilators, have asked patient to follow-up with PCP for further management of anxiety.    Procedures/Studies: None  Discharge Diagnoses:  Principal Problem:   Pneumomediastinum (HCC) Active Problems:   Asthma   Hypokalemia    Anxiety   Discharge Instructions:  Activity:  As tolerated   Discharge Instructions    Call MD for:  difficulty breathing, headache or visual disturbances   Complete by:  As directed    Diet general   Complete by:  As directed    Stay on a soft diet for 7 to 10 days.   Discharge instructions   Complete by:  As directed    Follow with Primary MD  Irena Reichmann, DO in 1 week  Please get a complete blood count and chemistry panel checked by your Primary MD at your next visit, and again as instructed by your Primary MD.  Get Medicines reviewed and adjusted: Please take all your medications with you for your next visit with your Primary MD  Laboratory/radiological data: Please request your Primary MD to go over all hospital tests and procedure/radiological results at the follow up, please ask your Primary MD to get all Hospital records sent to his/her office.  In some cases, they will be blood work, cultures and biopsy results pending at the time of your discharge. Please request that your primary care M.D. follows up on these results.  Also Note the following: If you experience worsening of your admission symptoms, develop shortness of breath, life threatening emergency, suicidal or homicidal thoughts you must seek medical attention immediately by calling 911 or calling your MD immediately  if symptoms less severe.  You must read complete instructions/literature along with all the possible adverse reactions/side effects for all the Medicines you take and that have been prescribed to you. Take any new Medicines after you have completely understood and accpet all the possible adverse reactions/side effects.   Do not drive when taking Pain medications or sleeping medications (Benzodaizepines)  Do not take more than prescribed  Pain, Sleep and Anxiety Medications. It is not advisable to combine anxiety,sleep and pain medications without talking with your primary care practitioner  Special  Instructions: If you have smoked or chewed Tobacco  in the last 2 yrs please stop smoking, stop any regular Alcohol  and or any Recreational drug use.  Wear Seat belts while driving.  Please note: You were cared for by a hospitalist during your hospital stay. Once you are discharged, your primary care physician will handle any further medical issues. Please note that NO REFILLS for any discharge medications will be authorized once you are discharged, as it is imperative that you return to your primary care physician (or establish a relationship with a primary care physician if you do not have one) for your post hospital discharge needs so that they can reassess your need for medications and monitor your lab values.   Increase activity slowly   Complete by:  As directed      Allergies as of 11/25/2018      Reactions   Singulair [montelukast] Other (See Comments)   "Limbs go numb"   Penicillins Rash      Medication List    STOP taking these medications   acetaminophen 325 MG tablet Commonly known as:  TYLENOL   ibuprofen 200 MG tablet Commonly known as:  ADVIL,MOTRIN   predniSONE 20 MG tablet Commonly known as:  DELTASONE     TAKE these medications   Albuterol Sulfate 108 (90 Base) MCG/ACT Aepb Commonly known as:  PROAIR RESPICLICK Inhale 2 puffs into the lungs every 4 (four) hours as needed.   benzonatate 200 MG capsule Commonly known as:  TESSALON Take 1 capsule (200 mg total) by mouth 3 (three) times daily as needed for cough.   Fluticasone-Salmeterol 250-50 MCG/DOSE Aepb Commonly known as:  ADVAIR DISKUS Inhale 1 puff into the lungs 2 (two) times daily.   hydrOXYzine 50 MG tablet Commonly known as:  ATARAX/VISTARIL Take 50 mg by mouth every 6 (six) hours as needed for anxiety.   pantoprazole 40 MG tablet Commonly known as:  PROTONIX Take 1 tablet (40 mg total) by mouth daily.      Follow-up Information    Irena Reichmann, DO. Schedule an appointment as soon as  possible for a visit in 1 week(s).   Specialty:  Family Medicine Contact information: [address] [place] [address] [place] [address] [phone]          Allergies  Allergen Reactions  . Singulair [Montelukast] Other (See Comments)    "Limbs go numb"  . Penicillins Rash   Consultations:   CTVS  Other Procedures/Studies: Dg Chest 2 View  Result Date: 11/03/2018 CLINICAL DATA:  Wheezing and dry cough today.  Asthma. EXAM: CHEST - 2 VIEW COMPARISON:  None. FINDINGS: The lungs are clear. Heart size is normal. There is no pneumothorax or pleural fluid. No bony abnormality. IMPRESSION: Normal chest. Electronically Signed   By: Drusilla Kanner M.D.   On: 11/03/2018 16:47   Ct Angio Chest Pe W Or Wo Contrast  Result Date: 11/21/2018 CLINICAL DATA:  Shortness of breath and chest pain. Vomiting today after panic attack. EXAM: CT ANGIOGRAPHY CHEST WITH CONTRAST TECHNIQUE: Multidetector CT imaging of the chest was performed using the standard protocol during bolus administration of intravenous contrast. Multiplanar CT image reconstructions and MIPs were obtained to evaluate the vascular anatomy. CONTRAST:  50mL ISOVUE-370 IOPAMIDOL (ISOVUE-370) INJECTION 76% COMPARISON:  None. FINDINGS: Cardiovascular: The pulmonary arteries are well opacified. There is no  evidence of pulmonary embolism. Central pulmonary arteries are of normal caliber. The thoracic aorta is normal in caliber. The heart size is normal. No pericardial fluid. No coronary artery calcifications. Mediastinum/Nodes: There is evidence abnormal air in the posterior mediastinum beginning at the level of the thoracic inlet and tracking along the course of the esophagus inferiorly all the way to the diaphragmatic hiatus. Air outlines the distal esophagus in particular and findings are most likely due to esophageal perforation. The esophagus itself is not dilated and does not contain fluid, debris or foreign object. There is no evidence  of an esophageal mass. No enlarged mediastinal, hilar, axillary or paraesophageal lymph nodes identified. Lungs/Pleura: There is no evidence of pulmonary edema, consolidation, pneumothorax, nodule or pleural fluid. Upper Abdomen: No acute abnormality. Musculoskeletal: No chest wall abnormality. No acute or significant osseous findings. Review of the MIP images confirms the above findings. IMPRESSION: Pneumomediastinum within the posterior mediastinum surrounding the esophagus with air extending from the thoracic inlet to the diaphragmatic hiatus. 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 These results were called by telephone at the time of interpretation on 11/21/2018 at 6:40 pm to Dr. Irena Reichmann , who verbally acknowledged these results. Due to the emergent findings, the patient will be transported to the [facility] Emergency Department urgently. Electronically Signed   By: Irish Lack M.D.   On: 11/21/2018 18:43   Dg Esophagus W Single Cm (sol Or Thin Ba)  Result Date: 11/21/2018 CLINICAL DATA:  23 year old female who had been vomiting found to have pneumomediastinum and gas surrounding the esophagus on Chest CT today. EXAM: ESOPHOGRAM/BARIUM SWALLOW TECHNIQUE: Single contrast examination was performed using water-soluble contrast and ultimately thin barium. FLUOROSCOPY TIME:  Fluoroscopy Time:  0 minutes 48 seconds Radiation Exposure Index (if provided by the fluoroscopic device): Number of Acquired Spot Images: 0 COMPARISON:  Chest CT and radiographs earlier today. FINDINGS: A single contrast study was undertaken and the patient tolerated this well and without difficulty. No obstruction to the forward flow of contrast throughout the esophagus and into the stomach. Normal esophageal course and contour. Normal gastroesophageal junction.  Negative for esophageal leak. IMPRESSION: Negative single contrast esophagram. No evidence of esophageal  perforation. Electronically Signed   By: Odessa Fleming M.D.   On: 11/21/2018 23:31     TODAY-DAY OF DISCHARGE:  Subjective:   Altie Savard today has no headache,no chest abdominal pain,no new weakness tingling or numbness, feels much better wants to go home today.   Objective:   Blood pressure (!) 90/53, pulse 62, temperature 97.9 F (36.6 C), resp. rate 16, height  (1.727 m), weight 102.5 kg, last menstrual period 10/29/2018, SpO2 100 %.  Intake/Output Summary (Last 24 hours) at 11/25/2018 1102 Last data filed at 11/25/2018 0553 Gross per 24 hour  Intake 240 ml  Output -  Net 240 ml   Filed Weights   11/22/18 1549  Weight: 102.5 kg    Exam: Awake Alert, Oriented *3, No new F.N deficits, Normal affect [place].AT,PERRAL Supple Neck,No JVD, No cervical lymphadenopathy appriciated.  Symmetrical Chest wall movement, Good air movement bilaterally, CTAB RRR,No Gallops,Rubs or new Murmurs, No Parasternal Heave +ve B.Sounds, Abd Soft, Non tender, No organomegaly appriciated, No rebound -guarding or rigidity. No Cyanosis, Clubbing or edema, No new Rash or bruise   PERTINENT RADIOLOGIC STUDIES: Dg Chest 2 View  Result Date: 11/03/2018 CLINICAL DATA:  Wheezing and dry cough today.  Asthma. EXAM: CHEST - 2 VIEW  COMPARISON:  None. FINDINGS: The lungs are clear. Heart size is normal. There is no pneumothorax or pleural fluid. No bony abnormality. IMPRESSION: Normal chest. Electronically Signed   By: Drusilla Kanner M.D.   On: 11/03/2018 16:47   Ct Angio Chest Pe W Or Wo Contrast  Result Date: 11/21/2018 CLINICAL DATA:  Shortness of breath and chest pain. Vomiting today after panic attack. EXAM: CT ANGIOGRAPHY CHEST WITH CONTRAST TECHNIQUE: Multidetector CT imaging of the chest was performed using the standard protocol during bolus administration of intravenous contrast. Multiplanar CT image reconstructions and MIPs were obtained to evaluate the vascular anatomy. CONTRAST:  80mL ISOVUE-370  IOPAMIDOL (ISOVUE-370) INJECTION 76% COMPARISON:  None. FINDINGS: Cardiovascular: The pulmonary arteries are well opacified. There is no evidence of pulmonary embolism. Central pulmonary arteries are of normal caliber. The thoracic aorta is normal in caliber. The heart size is normal. No pericardial fluid. No coronary artery calcifications. Mediastinum/Nodes: There is evidence abnormal air in the posterior mediastinum beginning at the level of the thoracic inlet and tracking along the course of the esophagus inferiorly all the way to the diaphragmatic hiatus. Air outlines the distal esophagus in particular and findings are most likely due to esophageal perforation. The esophagus itself is not dilated and does not contain fluid, debris or foreign object. There is no evidence of an esophageal mass. No enlarged mediastinal, hilar, axillary or paraesophageal lymph nodes identified. Lungs/Pleura: There is no evidence of pulmonary edema, consolidation, pneumothorax, nodule or pleural fluid. Upper Abdomen: No acute abnormality. Musculoskeletal: No chest wall abnormality. No acute or significant osseous findings. Review of the MIP images confirms the above findings. IMPRESSION: Pneumomediastinum within the posterior mediastinum surrounding the esophagus with air extending from the thoracic inlet to the diaphragmatic hiatus. 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 These results were called by telephone at the time of interpretation on 11/21/2018 at 6:40 pm to Dr. Irena Reichmann , who verbally acknowledged these results. Due to the emergent findings, the patient will be transported to the [facility] Department urgently. Electronically Signed   By: Irish Lack M.D.   On: 11/21/2018 18:43   Dg Esophagus W Single Cm (sol Or Thin Ba)  Result Date: 11/21/2018 CLINICAL DATA:  23 year old female who had been vomiting found to have pneumomediastinum and gas  surrounding the esophagus on Chest CT today. EXAM: ESOPHOGRAM/BARIUM SWALLOW TECHNIQUE: Single contrast examination was performed using water-soluble contrast and ultimately thin barium. FLUOROSCOPY TIME:  Fluoroscopy Time:  0 minutes 48 seconds Radiation Exposure Index (if provided by the fluoroscopic device): Number of Acquired Spot Images: 0 COMPARISON:  Chest CT and radiographs earlier today. FINDINGS: A single contrast study was undertaken and the patient tolerated this well and without difficulty. No obstruction to the forward flow of contrast throughout the esophagus and into the stomach. Normal esophageal course and contour. Normal gastroesophageal junction.  Negative for esophageal leak. IMPRESSION: Negative single contrast esophagram. No evidence of esophageal perforation. Electronically Signed   By: Odessa Fleming M.D.   On: 11/21/2018 23:31     PERTINENT LAB RESULTS: CBC: No results for input(s): WBC, HGB, HCT, PLT in the last 72 hours. CMET CMP     Component Value Date/Time   NA 136 11/23/2018 0742   K 3.8 11/23/2018 0742   CL 106 11/23/2018 0742   CO2 18 (L) 11/23/2018 0742   GLUCOSE 80 11/23/2018 0742   BUN 8 11/23/2018 0742   CREATININE 0.97 11/23/2018 0742  CALCIUM 8.6 (L) 11/23/2018 0742   PROT 7.1 11/21/2018 1955   ALBUMIN 3.9 11/21/2018 1955   AST 20 11/21/2018 1955   ALT 22 11/21/2018 1955   ALKPHOS 62 11/21/2018 1955   BILITOT 1.1 11/21/2018 1955   GFRNONAA >60 11/23/2018 0742   GFRAA >60 11/23/2018 0742    GFR Estimated Creatinine Clearance: 114.9 mL/min (by C-G formula based on SCr of 0.97 mg/dL). No results for input(s): LIPASE, AMYLASE in the last 72 hours. No results for input(s): CKTOTAL, CKMB, CKMBINDEX, TROPONINI in the last 72 hours. Invalid input(s): POCBNP No results for input(s): DDIMER in the last 72 hours. No results for input(s): HGBA1C in the last 72 hours. No results for input(s): CHOL, HDL, LDLCALC, TRIG, CHOLHDL, LDLDIRECT in the last 72  hours. No results for input(s): TSH, T4TOTAL, T3FREE, THYROIDAB in the last 72 hours.  Invalid input(s): FREET3 No results for input(s): VITAMINB12, FOLATE, FERRITIN, TIBC, IRON, RETICCTPCT in the last 72 hours. Coags: Recent Labs    11/22/18 1554  INR 1.07   Microbiology: Recent Results (from the past 240 hour(s))  Culture, blood (routine x 2)     Status: None (Preliminary result)   Collection Time: 11/21/18  7:56 PM  Result Value Ref Range Status   Specimen Description BLOOD LEFT ANTECUBITAL  Final   Special Requests   Final    BOTTLES DRAWN AEROBIC AND ANAEROBIC Blood Culture results may not be optimal due to an inadequate volume of blood received in culture bottles   Culture NO GROWTH 3 DAYS  Final   Report Status PENDING  Incomplete    FURTHER DISCHARGE INSTRUCTIONS:  Get Medicines reviewed and adjusted: Please take all your medications with you for your next visit with your Primary MD  Laboratory/radiological data: Please request your Primary MD to go over all hospital tests and procedure/radiological results at the follow up, please ask your Primary MD to get all Hospital records sent to his/her office.  In some cases, they will be blood work, cultures and biopsy results pending at the time of your discharge. Please request that your primary care M.D. goes through all the records of your hospital data and follows up on these results.  Also Note the following: If you experience worsening of your admission symptoms, develop shortness of breath, life threatening emergency, suicidal or homicidal thoughts you must seek medical attention immediately by calling 911 or calling your MD immediately  if symptoms less severe.  You must read complete instructions/literature along with all the possible adverse reactions/side effects for all the Medicines you take and that have been prescribed to you. Take any new Medicines after you have completely understood and accpet all the possible  adverse reactions/side effects.   Do not drive when taking Pain medications or sleeping medications (Benzodaizepines)  Do not take more than prescribed Pain, Sleep and Anxiety Medications. It is not advisable to combine anxiety,sleep and pain medications without talking with your primary care practitioner  Special Instructions: If you have smoked or chewed Tobacco  in the last 2 yrs please stop smoking, stop any regular Alcohol  and or any Recreational drug use.  Wear Seat belts while driving.  Please note: You were cared for by a hospitalist during your hospital stay. Once you are discharged, your primary care physician will handle any further medical issues. Please note that NO REFILLS for any discharge medications will be authorized once you are discharged, as it is imperative that you return to your primary care physician (or  establish a relationship with a primary care physician if you do not have one) for your post hospital discharge needs so that they can reassess your need for medications and monitor your lab values.  Total Time spent coordinating discharge including counseling, education and face to face time equals 25 minutes.  SignedJeoffrey Massed 11/25/2018 11:02 AM

## 2018-11-26 LAB — CULTURE, BLOOD (ROUTINE X 2): Culture: NO GROWTH

## 2018-12-16 ENCOUNTER — Emergency Department
Admission: EM | Admit: 2018-12-16 | Discharge: 2018-12-16 | Disposition: A | Discharge status: Home / Self Care | Payer: Commercial Managed Care - PPO | Attending: Emergency Medicine | Admitting: Emergency Medicine

## 2018-12-16 ENCOUNTER — Emergency Department: Payer: Commercial Managed Care - PPO

## 2018-12-16 DIAGNOSIS — F419 Anxiety disorder, unspecified: Secondary | ICD-10-CM

## 2018-12-16 DIAGNOSIS — R0602 Shortness of breath: Secondary | ICD-10-CM | POA: Insufficient documentation

## 2018-12-16 HISTORY — DX: Unspecified asthma, uncomplicated: J45.909

## 2018-12-16 LAB — ECG 12-LEAD
Atrial Rate: 0 {beats}/min
Atrial Rate: 84 {beats}/min
Atrial Rate: 91 {beats}/min
P Axis: 48 degrees
P Axis: 50 degrees
P-R Interval: 136 ms
P-R Interval: 146 ms
Q-T Interval: 0 ms
Q-T Interval: 360 ms
Q-T Interval: 376 ms
QRS Duration: 0 ms
QRS Duration: 68 ms
QRS Duration: 74 ms
QTC Calculation (Bezet): 0 ms
QTC Calculation (Bezet): 442 ms
QTC Calculation (Bezet): 444 ms
R Axis: 0 degrees
R Axis: 48 degrees
R Axis: 49 degrees
T Axis: 0 degrees
T Axis: 29 degrees
T Axis: 43 degrees
Ventricular Rate: 0 {beats}/min
Ventricular Rate: 84 {beats}/min
Ventricular Rate: 91 {beats}/min

## 2018-12-16 LAB — CBC AND DIFFERENTIAL
Absolute NRBC: 0 10*3/uL (ref 0.00–0.00)
Basophils Absolute Automated: 0.06 10*3/uL (ref 0.00–0.08)
Basophils Automated: 0.6 %
Eosinophils Absolute Automated: 0.06 10*3/uL (ref 0.00–0.44)
Eosinophils Automated: 0.6 %
Hematocrit: 37.1 % (ref 34.7–43.7)
Hgb: 12.7 g/dL (ref 11.4–14.8)
Immature Granulocytes Absolute: 0.03 10*3/uL (ref 0.00–0.07)
Immature Granulocytes: 0.3 %
Lymphocytes Absolute Automated: 3.54 10*3/uL — ABNORMAL HIGH (ref 0.42–3.22)
Lymphocytes Automated: 34.8 %
MCH: 32.4 pg (ref 25.1–33.5)
MCHC: 34.2 g/dL (ref 31.5–35.8)
MCV: 94.6 fL (ref 78.0–96.0)
MPV: 10.6 fL (ref 8.9–12.5)
Monocytes Absolute Automated: 0.77 10*3/uL (ref 0.21–0.85)
Monocytes: 7.6 %
Neutrophils Absolute: 5.71 10*3/uL (ref 1.10–6.33)
Neutrophils: 56.1 %
Nucleated RBC: 0 /100 WBC (ref 0.0–0.0)
Platelets: 267 10*3/uL (ref 142–346)
RBC: 3.92 10*6/uL (ref 3.90–5.10)
RDW: 12 % (ref 11–15)
WBC: 10.17 10*3/uL — ABNORMAL HIGH (ref 3.10–9.50)

## 2018-12-16 LAB — COMPREHENSIVE METABOLIC PANEL
ALT: 9 U/L (ref 0–55)
AST (SGOT): 14 U/L (ref 5–34)
Albumin/Globulin Ratio: 1.4 (ref 0.9–2.2)
Albumin: 4.1 g/dL (ref 3.5–5.0)
Alkaline Phosphatase: 82 U/L (ref 37–106)
BUN: 11 mg/dL (ref 7.0–19.0)
Bilirubin, Total: 0.6 mg/dL (ref 0.2–1.2)
CO2: 15 mEq/L — ABNORMAL LOW (ref 22–29)
Calcium: 9.6 mg/dL (ref 8.5–10.5)
Chloride: 110 mEq/L (ref 100–111)
Creatinine: 0.9 mg/dL (ref 0.6–1.0)
Globulin: 2.9 g/dL (ref 2.0–3.6)
Glucose: 79 mg/dL (ref 70–100)
Potassium: 3.5 mEq/L (ref 3.5–5.1)
Protein, Total: 7 g/dL (ref 6.0–8.3)
Sodium: 138 mEq/L (ref 136–145)

## 2018-12-16 LAB — POCT PREGNANCY TEST, URINE HCG: POCT Pregnancy HCG Test, UR: NEGATIVE

## 2018-12-16 LAB — URINALYSIS REFLEX TO MICROSCOPIC EXAM - REFLEX TO CULTURE
Bilirubin, UA: NEGATIVE
Glucose, UA: NEGATIVE
Ketones UA: NEGATIVE
Leukocyte Esterase, UA: NEGATIVE
Nitrite, UA: NEGATIVE
Protein, UR: NEGATIVE
Specific Gravity UA: 1.012 (ref 1.001–1.035)
Urine pH: 8 (ref 5.0–8.0)
Urobilinogen, UA: NORMAL mg/dL (ref 0.2–2.0)

## 2018-12-16 LAB — GFR: EGFR: >60

## 2018-12-16 MED ORDER — SODIUM CHLORIDE 0.9 % IV BOLUS
1000.00 mL | Freq: Once | INTRAVENOUS | Status: AC
Start: 2018-12-16 — End: 2018-12-16
  Administered 2018-12-16: 23:00:00 1000 mL via INTRAVENOUS

## 2018-12-16 MED ORDER — LORAZEPAM 1 MG PO TABS
1.00 mg | ORAL_TABLET | Freq: Once | ORAL | Status: AC
Start: 2018-12-16 — End: 2018-12-16
  Administered 2018-12-16: 23:00:00 1 mg via ORAL
  Filled 2018-12-16: qty 1

## 2018-12-16 MED ORDER — ONDANSETRON 4 MG PO TBDP
4.00 mg | ORAL_TABLET | Freq: Once | ORAL | Status: AC
Start: 2018-12-16 — End: 2018-12-16
  Administered 2018-12-16: 23:00:00 4 mg via ORAL
  Filled 2018-12-16: qty 1

## 2018-12-16 NOTE — Discharge Instructions (Signed)
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    Hyperventilation means breathing too fast. This can happen for many reasons. It happens when you are breathing too deeply or too fast (or both). When your normal breathing changes, the chemistry of your blood changes a little. This can lead to many uncomfortable symptoms.    Symptoms of hyperventilation include:   The feeling that you can't get enough air even though you are breathing quickly and deeply.   Your chest hurts from trying to breathe too hard. This pain can be an ache or a burning sensation. It can even be a sharp, stabbing feeling.   You feel numbness or tingling in your face, especially around the lips and mouth.   You feel numbness or tingling in your hands or feet.   There is muscle cramping in the hands (and feet).    Hyperventilation can have non-dangerous causes like anxiety or increased stress. It can also be a reaction to an emotional situation or a painful injury. Sometimes, people who have asthma or bronchitis (or any illness that makes it hard to breathe) hyperventilate when they try to breathe harder and overcome the increased work of breathing. Sometimes, the cause can be something more serious. The cause could be pneumonia or a blood clot in the lungs (pulmonary embolism).    Hyperventilation is normally treated by relaxing and trying to slow down your breathing. Blowing into a paper bag and breathing the exhaled air back in may or may not be helpful.    The cause of your hyperventilation does not seem to be dangerous. It is OK for you to leave at this time.    You should follow up with your doctor or the referral doctor.    YOU SHOULD SEEK MEDICAL ATTENTION IMMEDIATELY, EITHER HERE OR AT THE NEAREST EMERGENCY DEPARTMENT, IF ANY OF THE FOLLOWING OCCURS:   Your symptoms get worse.   You have increased shortness of breath or breathing is more difficult.   You have bad chest pain.   You have any  swelling in one leg with or without pain in your calf.   You run a fever (temperature higher than 100.64F / 38C) or have a cough that brings up blood or yellow-green mucus (sputum).               Shortness of Breath, Unclear Etiology    You have been seen today for shortness of breath, or difficulty breathing; but we dont know the cause.    This means that after talking about your medical problems, doing a physical exam, and looking at the tests that were done, there is still not a clear explanation as to why you are short of breath. You may need to go see your doctor for another exam or more tests to find out why you are short of breath. Marland Kitchen    CAUTION: Even after a workup, including EKGs, lab tests, x-rays and even CAT Scans (CT scans), it is still possible to have a serious problem that cannot be detected at first.    The doctor caring for you feels that the cause of your shortness of breath is not from a life-threatening problem.    Some of the more dangerous medical problems such as a heart attack, blood clot in the lung (pulmonary embolism), asthma, collapsed lung, heart failure, etc. have been looked at, but are not felt to be the cause of your shortness of breath.  Shortness of breath is a serious symptom and must be handled carefully. It is VERY IMPORTANT that you see your regular doctor and return for re-evaluation or seek medical attention immediately if your symptoms become worse or change.    YOU SHOULD SEEK MEDICAL ATTENTION IMMEDIATELY, EITHER HERE OR AT THE NEAREST EMERGENCY DEPARTMENT, IF ANY OF THE FOLLOWING OCCURS:   Your shortness of breath returns.   You notice that your shortness of breath happens as you walk, go up stairs, or exert yourself.   If you develop chest pain, sweating, or nausea (feel sick to your stomach).   Rapid heartbeat.   You have any weakness, lightheadedness or you get so short of breath that you pass out.   It hurts to breathe.   Your leg swells.   Any  other worsening symptoms or problems.   If you are unable to sleep because of shortness of breath, or you wake in the middle of the night short of breath.               Anxiety, Panic    You have been diagnosed with an anxiety attack.    You seem to have had an anxiety attack. There are many conditions that can cause symptoms like these. If this is the first time this has happened, follow-up with your regular doctor. You may need more testing to be sure there isnt another cause for your symptoms.    Anxiety causes very strong feelings of worry and fear. It may also cause chest pain or shortness of breath. You may feel like you have palpitations (a racing heart). You might feel numbness (like parts of your body are "asleep"), especially around the mouth and in the hands or feet.    Follow up with your counselor and family doctor. If you do not have an appointment in the next 2-3 days, call and make one. It is VERY IMPORTANT for your counselor and family doctor to know if you get worse.    YOU SHOULD SEEK MEDICAL ATTENTION IMMEDIATELY, EITHER HERE OR AT THE NEAREST EMERGENCY DEPARTMENT, IF ANY OF THE FOLLOWING OCCURS:   You have symptoms that you normally dont have with your anxiety attacks.   You think of harming yourself (suicidal thoughts) or harming someone else.   You have symptoms you normally dont have and they last longer than normal or your medicine doesn't help. These include chest pain, passing out, feeling that your heart is racing or shortness of breath.   You have a fever (temperature higher than 100.63F / 38C).

## 2018-12-16 NOTE — ED Provider Notes (Signed)
EMERGENCY DEPARTMENT HISTORY AND PHYSICAL EXAM    Date Time: 12/17/18 10:15 PM  Patient Name: Kathy Brewer  Attending Physician: Jeralyn Ruths. Joseph Art, MD      Clinical course in ED:   Final diagnoses:  Final diagnoses:   Anxiety   Shortness of breath       ED Disposition:   ED Disposition     ED Disposition Condition Date/Time Comment    Discharge  Sat Dec 16, 2018 11:15 PM Kathy Brewer discharge to home/self care.    Condition at disposition: Stable          Clinical course in ED:  23 y.o. female with hx mild intermitt asthma and anxiety, tx'd with prn albuterol and Wellbutrin, p/w sob x 2 weeks and prod cough with occasional vomiting. No fever, recent travel, known viral exposures. 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 No abd pain, chest pain, diarrhea. On exam, pt is visibly anxious and wishes to communicate with me via text at the bedside. She is able to converse with redirection and encouragement. Lungs are clear, no wheezing; RR appears elev d/t anxiety but no incr WOB. Otherwise nrml PE. W/u reveals unrem ekg/cxr/labs, pt's sx improved after oral ativan, remains well appearing. Referred to [facility] medicine for establishment of primary care, aware, to return for worsening sx.    Medications given in ED:  ED Medication Orders (From admission, onward)    Start Ordered     Status Ordering Provider    12/16/18 2216 12/16/18 2215  sodium chloride 0.9 % bolus 1,000 mL  Once     Route: Intravenous  Ordered Dose: 1,000 mL     Last MAR action:  New Bag Princella Jaskiewicz H    12/16/18 2216 12/16/18 2215  LORazepam (ATIVAN) tablet 1 mg  Once     Route: Oral  Ordered Dose: 1 mg     Last MAR action:  Given Dagoberto Nealy H    12/16/18 2216 12/16/18 2215  ondansetron (ZOFRAN-ODT) disintegrating tablet 4 mg  Once     Route: Oral  Ordered Dose: 4 mg     Last MAR action:  Given Maddix Heinz H        History of Presenting Illness:     Chief  Complaint: spb  History obtained from: Patient.  Onset/Duration: 2 weeks  Quality: "tightness"  Severity:mod  Aggravating Factors: unk  Alleviating Factors: none  Associated Symptoms: As below  Narrative/Additional Historical Findings: 23 y.o. female with hx mild intermitt asthma and anxiety, tx'd with prn albuterol and Wellbutrin, p/w sob x 2 weeks and prod cough with occasional vomiting. No fever, recent travel, known viral exposures. 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 No abd pain, chest pain, diarrhea.     Past Medical History:     Past Medical History:   Diagnosis Date    Asthma        Past Surgical History:     History reviewed. No pertinent surgical history.    Family History:     History reviewed. No pertinent family history.    Social History:     Social History     Socioeconomic History    Marital status: Unknown     Spouse name: Not on file    Number of children: Not on file  Years of education: Not on file    Highest education level: Not on file   Occupational History    Not on file   Social Needs    Financial resource strain: Not on file    Food insecurity:     Worry: Not on file     Inability: Not on file    Transportation needs:     Medical: Not on file     Non-medical: Not on file   Tobacco Use    Smoking status: Never Smoker    Smokeless tobacco: Never Used   Substance and Sexual Activity    Alcohol use: Never     Frequency: Never    Drug use: Never    Sexual activity: Not on file   Lifestyle    Physical activity:     Days per week: Not on file     Minutes per session: Not on file    Stress: Not on file   Relationships    Social connections:     Talks on phone: Not on file     Gets together: Not on file     Attends religious service: Not on file     Active member of club or organization: Not on file     Attends meetings of clubs or organizations: Not on file     Relationship status: Not  on file    Intimate partner violence:     Fear of current or ex partner: Not on file     Emotionally abused: Not on file     Physically abused: Not on file     Forced sexual activity: Not on file   Other Topics Concern    Not on file   Social History Narrative    Not on file       Allergies:     Allergies   Allergen Reactions    Singulair [Montelukast]      Numbness in fingers and toes    Penicillins Rash       Medications:     (Not in a hospital admission)      Review of Systems:     Pertinent Positives and Negatives noted in the HPI.  All Other Systems Reviewed and Negative: Yes    Physical Exam:     Vitals:    12/16/18 2315   BP: 130/81   Pulse: 68   Resp: 20   Temp:    SpO2: 100%     Constitutional: Vital signs reviewed. Pt is anxious on eval, wants to communicate with me via text message at bedside.  Head: Normocephalic, atraumatic  Eyes: No conjunctival injection. No discharge.  ENT: Mucous membranes moist  Neck: Normal range of motion. Non-tender.  Respiratory/Chest: Clear to auscultation. No respiratory distress.   Cardiovascular: Regular rate and rhythm. No murmur.   Abdomen: Soft and non-tender. No guarding. No masses or hepatosplenomegaly.  Back: Nrml appearance, no flank ttp, no midline ttp or masses.   LowerExtremity: No edema. No cyanosis.  Neurological: No focal motor deficits. Speech normal. Memory normal.  Msk: Nrml ROM, non-ttp  Skin: Warm and dry. No rash.  Lymphatic: No cervical lymphadenopathy.  Psychiatric: Extremely anxious affect. Poor concentration.    Labs:     Results     Procedure Component Value Units Date/Time    UA Reflex to Micro - Reflex to Culture [[id number]]  (Abnormal) Collected:  12/16/18 2247     Updated:  12/16/18 2314     Urine Type  Urine, Clean Ca     Color, UA Yellow     Clarity, UA Clear     Specific Gravity UA 1.012     Urine pH 8.0     Leukocyte Esterase, UA Negative     Nitrite, UA Negative     Protein, UR Negative     Glucose, UA Negative     Ketones UA Negative      Urobilinogen, UA Normal mg/dL      Bilirubin, UA Negative     Blood, UA Small     RBC, UA 0 - 2 /hpf      WBC, UA 0 - 5 /hpf      Squamous Epithelial Cells, Urine 0 - 5 /hpf     Narrative:       Replace urinary catheter prior to obtaining the urine culture  if it has been in place for greater than or equal to 14  days:->N/A No Foley  Indications for U/A Reflex to Micro - Reflex to  Culture:->Suprapubic Pain/Tenderness or Dysuria  Rescheduled by [id number] at 12/16/2018 22:33 Reason: Difficult draw/Unable   to collect specimen    GFR [[id number]] Collected:  12/16/18 2233     Updated:  12/16/18 2308     EGFR >60.0    Narrative:       Replace urinary catheter prior to obtaining the urine culture  if it has been in place for greater than or equal to 14  days:->N/A No Foley  Indications for U/A Reflex to Micro - Reflex to  Culture:->Suprapubic Pain/Tenderness or Dysuria    Comprehensive metabolic panel [[id number]]  (Abnormal) Collected:  12/16/18 2233    Specimen:  Blood Updated:  12/16/18 2308     Glucose 79 mg/dL      BUN 95.2 mg/dL      Creatinine 0.9 mg/dL      Sodium 841 mEq/L      Potassium 3.5 mEq/L      Chloride 110 mEq/L      CO2 15 mEq/L      Calcium 9.6 mg/dL      Protein, Total 7.0 g/dL      Albumin 4.1 g/dL      AST (SGOT) 14 U/L      ALT 9 U/L      Alkaline Phosphatase 82 U/L      Bilirubin, Total 0.6 mg/dL      Globulin 2.9 g/dL      Albumin/Globulin Ratio 1.4    Narrative:       Replace urinary catheter prior to obtaining the urine culture  if it has been in place for greater than or equal to 14  days:->N/A No Foley  Indications for U/A Reflex to Micro - Reflex to  Culture:->Suprapubic Pain/Tenderness or Dysuria    Urine HCG, POC/ Qualitative [[id number]] Collected:  12/16/18 2303    Specimen:  Urine Updated:  12/16/18 2306     POCT QC Pass     POCT Pregnancy HCG Test, UR Negative     Comment: Negative Value is Normal in Healthy Males or Healthy non-pregnant Females    CBC and differential [[id number]]   (Abnormal) Collected:  12/16/18 2233    Specimen:  Blood Updated:  12/16/18 2251     WBC 10.17 x10 3/uL      Hgb 12.7 g/dL      Hematocrit 47.4 %      Platelets 267 x10 3/uL      RBC 3.92 x10 6/uL  MCV 94.6 fL      MCH 32.4 pg      MCHC 34.2 g/dL      RDW 12 %      MPV 10.6 fL      Neutrophils 56.1 %      Lymphocytes Automated 34.8 %      Monocytes 7.6 %      Eosinophils Automated 0.6 %      Basophils Automated 0.6 %      Immature Granulocyte 0.3 %      Nucleated RBC 0.0 /100 WBC      Neutrophils Absolute 5.71 x10 3/uL      Abs Lymph Automated 3.54 x10 3/uL      Abs Mono Automated 0.77 x10 3/uL      Abs Eos Automated 0.06 x10 3/uL      Absolute Baso Automated 0.06 x10 3/uL      Absolute Immature Granulocyte 0.03 x10 3/uL      Absolute NRBC 0.00 x10 3/uL     Narrative:       Replace urinary catheter prior to obtaining the urine culture  if it has been in place for greater than or equal to 14  days:->N/A No Foley  Indications for U/A Reflex to Micro - Reflex to  Culture:->Suprapubic Pain/Tenderness or Dysuria            Rads:     XR Chest 2 Views   Final Result     Normal chest.      Gerlene Burdock, MD    12/16/2018 11:20 PM          Medical Decision Making:   I reviewed the vital signs, nursing notes, past medical history, past surgical history, family history and social history.  Vital Signs -   No data found.    Procedures:  Procedures    Interpretations:    Differential Diagnosis (not completely inclusive): anxiety, pna, asthma exac, pneumomediastinum, ptx, viral uri, other    EKG was Reviewed, Analyzed and Interpreted by me: SR 84, nrml axis/intervals, no ischemia. Impression: Nrml EKG    Rhythm Strip Interpretation / Cardiac Monitor Analysis: SR 80's    Pulse Ox Analysis: Yes: saturation: 100 %; Oxygen use: room air; Interpretation: Normal      Critical Care Time: none    Attestations:       This note and the patient instructions accurately reflect work and decisions made by me.     Signed by: Jeralyn Ruths Joseph Art, MD      Leitha Schuller, MD  12/17/18 1340

## 2018-12-17 LAB — TYPE AND SCREEN
AB Screen Gel: NEGATIVE
ABO Rh: O POS

## 2019-02-01 ENCOUNTER — Encounter (HOSPITAL_BASED_OUTPATIENT_CLINIC_OR_DEPARTMENT_OTHER): Payer: Self-pay

## 2019-02-01 NOTE — Progress Notes (Signed)
Left VM for patient regarding IPAC screening/scheduling [phone]).  Patient asked to return call to complete screening for [facility] services.    Consuella Lose, Ph.D.

## 2019-02-02 ENCOUNTER — Encounter (HOSPITAL_BASED_OUTPATIENT_CLINIC_OR_DEPARTMENT_OTHER): Payer: Self-pay

## 2019-02-02 ENCOUNTER — Telehealth (INDEPENDENT_AMBULATORY_CARE_PROVIDER_SITE_OTHER): Payer: No Typology Code available for payment source | Admitting: Psychiatry

## 2019-02-02 ENCOUNTER — Encounter (HOSPITAL_BASED_OUTPATIENT_CLINIC_OR_DEPARTMENT_OTHER): Payer: Self-pay | Admitting: Psychiatry

## 2019-02-02 VITALS — BP 118/69 | HR 98 | Temp 97.3°F | Resp 16 | Ht 68.0 in | Wt 203.0 lb

## 2019-02-02 DIAGNOSIS — F064 Anxiety disorder due to known physiological condition: Secondary | ICD-10-CM

## 2019-02-02 DIAGNOSIS — F4321 Adjustment disorder with depressed mood: Secondary | ICD-10-CM

## 2019-02-02 DIAGNOSIS — F5105 Insomnia due to other mental disorder: Secondary | ICD-10-CM

## 2019-02-02 DIAGNOSIS — F99 Mental disorder, not otherwise specified: Secondary | ICD-10-CM

## 2019-02-02 MED ORDER — MIRTAZAPINE 15 MG PO TABS
15.0000 mg | ORAL_TABLET | Freq: Every evening | ORAL | 2 refills | Status: DC
Start: 2019-02-02 — End: 2019-03-07

## 2019-02-02 MED ORDER — HYDROXYZINE HCL 25 MG PO TABS
25.0000 mg | ORAL_TABLET | Freq: Four times a day (QID) | ORAL | 2 refills | Status: AC | PRN
Start: 2019-02-02 — End: 2019-05-03

## 2019-02-02 NOTE — Patient Instructions (Signed)
MEDICATION INSTRUCTIONS                    1. Take all medications as prescribed; do not stop medications or change dosages without talking to your provider(s).  2. Abstain from alcohol and/or illegal drugs as they interfere with psychiatric medications.   3. Immediately go to the nearest emergency department or call 911 if you have any thoughts of wanting to harm yourself or others, or for any other crisis.  4. Consult with your pharmacist if you questions about your medications, their side effects or possible interactions with other medications you take.      FOLLOW-UP CARE APPOINTMENTS    1. PSYCHIATRIC MEDICATION MANAGEMENT: a trial a Remeron   2. PSYCHOTHERAPY: If you do not already have a therapist, find one by contact your insurance provider for a list of in-network providers. Time-limited, psychotherapy programs are available through [place] by calling [phone]. Another source for therapists is http://www.psychology-today.com/.  3. Return to [facility] in 2-4 weeks or anytime if necessary (but not for routine matters like refills). [facility] Walk-in hours are 10a-6p Monday thru Saturday, excluding Thanksgiving, Christmas and New Years Day. We close early at 3pm on the first Wednesday of each month for staff meetings.      ABOUT YOUR MEDICATIONS:    Hydroxyzine capsules or tablets  Brand Names: ANX, Atarax, Rezine, Vistaril  What is this medicine?  HYDROXYZINE (hye DROX i zeen) is an antihistamine. This medicine is used to treat allergy symptoms. It is also used to treat anxiety and tension. This medicine can be used with other medicines to induce sleep before surgery.  How should I use this medicine?  Take this medicine by mouth with a full glass of water. Follow the directions on the prescription label. You may take this medicine with food or on an empty stomach. Take your medicine at regular intervals. Do not take your medicine more often than directed.  Talk to your pediatrician regarding the use of this medicine  in children. Special care may be needed. While this drug may be prescribed for children as young as 71 years of age for selected conditions, precautions do apply.  Patients over 26 years old may have a stronger reaction and need a smaller dose.  What side effects may I notice from receiving this medicine?  Side effects that you should report to your doctor or health care professional as soon as possible:   allergic reactions like skin rash, itching or hives, swelling of the face, lips, or tongue   changes in vision   confusion   fast, irregular heartbeat   seizures   tremor   trouble passing urine or change in the amount of urine  Side effects that usually do not require medical attention (report to your doctor or health care professional if they continue or are bothersome):   constipation   drowsiness   dry mouth   headache   tiredness  What may interact with this medicine?  Do not take this medicine with any of the following medications:   cisapride   dofetilide   dronedarone   pimozide   thioridazine  This medicine may also interact with the following medications:   alcohol   antihistamines for allergy, cough, and cold   atropine   barbiturate medicines for sleep or seizures, like phenobarbital   certain antibiotics like erythromycin or clarithromycin   certain medicines for anxiety or sleep   certain medicines for bladder problems like oxybutynin,  tolterodine   certain medicines for depression or psychotic disturbances   certain medicines for irregular heart beat   certain medicines for Parkinson's disease like benztropine, trihexyphenidyl   certain medicines for seizures like phenobarbital, primidone   certain medicines for stomach problems like dicyclomine, hyoscyamine   certain medicines for travel sickness like scopolamine   ipratropium   narcotic medicines for pain   other medicines that prolong the QT interval (an abnormal heart rhythm)  What if I miss a dose?  If you miss a  dose, take it as soon as you can. If it is almost time for your next dose, take only that dose. Do not take double or extra doses.  Where should I keep my medicine?  Keep out of the reach of children.  Store at room temperature between 15 and 30 degrees C (59 and 86 degrees F). Keep container tightly closed. Throw away any unused medicine after the expiration date.  What should I tell my health care provider before I take this medicine?  They need to know if you have any of these conditions:   glaucoma   heart disease   history of irregular heartbeat   kidney disease   liver disease   lung or breathing disease, like asthma   stomach or intestine problems   thyroid disease   trouble passing urine   an unusual or allergic reaction to hydroxyzine, cetirizine, other medicines, foods, dyes or preservatives   pregnant or trying to get pregnant   breast-feeding  What should I watch for while using this medicine?  Tell your doctor or health care professional if your symptoms do not improve.  You may get drowsy or dizzy. Do not drive, use machinery, or do anything that needs mental alertness until you know how this medicine affects you. Do not stand or sit up quickly, especially if you are an older patient. This reduces the risk of dizzy or fainting spells. Alcohol may interfere with the effect of this medicine. Avoid alcoholic drinks.  Your mouth may get dry. Chewing sugarless gum or sucking hard candy, and drinking plenty of water may help. Contact your doctor if the problem does not go away or is severe.  This medicine may cause dry eyes and blurred vision. If you wear contact lenses you may feel some discomfort. Lubricating drops may help. See your eye doctor if the problem does not go away or is severe.  If you are receiving skin tests for allergies, tell your doctor you are using this medicine.  NOTE:This sheet is a summary. It may not cover all possible information. If you have questions about this  medicine, talk to your doctor, pharmacist, or health care provider. Copyright 2020 Elsevier        Mirtazapine tablets  Brand Name: Remeron  What is this medicine?  MIRTAZAPINE (mir TAZ a peen) is used to treat depression.  How should I use this medicine?  Take this medicine by mouth with a glass of water. Follow the directions on the prescription label. Take your medicine at regular intervals. Do not take your medicine more often than directed. Do not stop taking this medicine suddenly except upon the advice of your doctor. Stopping this medicine too quickly may cause serious side effects or your condition may worsen.  A special MedGuide will be given to you by the pharmacist with each prescription and refill. Be sure to read this information carefully each time.  Talk to your pediatrician regarding the use of  this medicine in children. Special care may be needed.  What side effects may I notice from receiving this medicine?  Side effects that you should report to your doctor or health care professional as soon as possible:   allergic reactions like skin rash, itching or hives, swelling of the face, lips, or tongue   anxious   changes in vision   chest pain   confusion   elevated mood, decreased need for sleep, racing thoughts, impulsive behavior   eye pain   fast, irregular heartbeat   feeling faint or lightheaded, falls   feeling agitated, angry, or irritable   fever or chills, sore throat   hallucination, loss of contact with reality   loss of balance or coordination   mouth sores   redness, blistering, peeling or loosening of the skin, including inside the mouth   restlessness, pacing, inability to keep still   seizures   stiff muscles   suicidal thoughts or other mood changes   trouble passing urine or change in the amount of urine   trouble sleeping   unusual bleeding or bruising   unusually weak or tired   vomiting  Side effects that usually do not require medical attention (report  to your doctor or health care professional if they continue or are bothersome):   change in appetite   constipation   dizziness   dry mouth   muscle aches or pains   nausea   tired   weight gain  What may interact with this medicine?  Do not take this medicine with any of the following medications:   linezolid   MAOIs like Carbex, Eldepryl, Marplan, Nardil, and Parnate   methylene blue (injected into a vein)  This medicine may also interact with the following medications:   alcohol   antiviral medicines for HIV or AIDS   certain medicines that treat or prevent blood clots like warfarin   certain medicines for depression, anxiety, or psychotic disturbances   certain medicines for fungal infections like ketoconazole and itraconazole   certain medicines for migraine headache like almotriptan, eletriptan, frovatriptan, naratriptan, rizatriptan, sumatriptan, zolmitriptan   certain medicines for seizures like carbamazepine or phenytoin   certain medicines for sleep   cimetidine   erythromycin   fentanyl   lithium   medicines for blood pressure   nefazodone   rasagiline   rifampin   supplements like St. John's wort, kava kava, valerian   tramadol   tryptophan  What if I miss a dose?  If you miss a dose, take it as soon as you can. If it is almost time for your next dose, take only that dose. Do not take double or extra doses.  Where should I keep my medicine?  Keep out of the reach of children.  Store at room temperature between 15 and 30 degrees C (59 and 86 degrees F) Protect from light and moisture. Throw away any unused medicine after the expiration date.  What should I tell my health care provider before I take this medicine?  They need to know if you have any of these conditions:   bipolar disorder   glaucoma   kidney disease   liver disease   suicidal thoughts   an unusual or allergic reaction to mirtazapine, other medicines, foods, dyes, or preservatives   pregnant or trying to  get pregnant   breast-feeding  What should I watch for while using this medicine?  Tell your doctor if your symptoms do not get  better or if they get worse. Visit your doctor or health care professional for regular checks on your progress. Because it may take several weeks to see the full effects of this medicine, it is important to continue your treatment as prescribed by your doctor.  Patients and their families should watch out for new or worsening thoughts of suicide or depression. Also watch out for sudden changes in feelings such as feeling anxious, agitated, panicky, irritable, hostile, aggressive, impulsive, severely restless, overly excited and hyperactive, or not being able to sleep. If this happens, especially at the beginning of treatment or after a change in dose, call your health care professional.  Bonita Quin may get drowsy or dizzy. Do not drive, use machinery, or do anything that needs mental alertness until you know how this medicine affects you. Do not stand or sit up quickly, especially if you are an older patient. This reduces the risk of dizzy or fainting spells. Alcohol may interfere with the effect of this medicine. Avoid alcoholic drinks.  This medicine may cause dry eyes and blurred vision. If you wear contact lenses you may feel some discomfort. Lubricating drops may help. See your eye doctor if the problem does not go away or is severe.  Your mouth may get dry. Chewing sugarless gum or sucking hard candy, and drinking plenty of water may help. Contact your doctor if the problem does not go away or is severe.  NOTE:This sheet is a summary. It may not cover all possible information. If you have questions about this medicine, talk to your doctor, pharmacist, or health care provider. Copyright 2020 Elsevier            [facility] RESOURCES    [facility]  - [phone] 24/7/365  For admissions and screening for all [facility] Services, including:   Comprehensive Addiction  Treatment Services (CATS) Inpatient Detox, IOP Intensive Outpatient Programs   Partial Hospitalization Program (PHP), Outpatient psychiatry, Outpatient counseling        [facility]  [facility]  [address]  [place], [place] [address] For Urgent Adult (18 and Over) Psychiatric Assessments [phone]     [facility]  [address]  [place], [place] [address]   For Child and Youth (Under 18) Mental Health and Substance Abuse Outpatient Services [phone]   [facility] [place]  [address]  [place], [place] [address] For Non-Urgent Psychiatric Appointments: [phone]   [facility]-   [facility]  [address]  [place], [place] [address]   For Non-Urgent Psychiatric Appointments: [phone]   [facility] [place]  [address] [place], [place] [address]   For Non-Urgent Psychiatric Appointments: [phone]   [facility]- [address]  [address]  [place], [place] [address]   For Non-Urgent Psychiatric Appointments: [phone]   [facility] [place]  [address]   [place], [place] [address]   For Non-Urgent Psychiatric Appointments: [phone]         COMMUNITY RESOURCES (MENTAL HEALTH CENTERS):      [facility]  Entry and Referral Services [phone]     [facility], [place], [place] [phone]   [facility], [place], [place] [phone]     [facility], [place], [place] [phone]     [place]  [facility], [place], [place] [phone]     [facility]    [facility], [place], [place]  [phone]       [facility]    [facility], [place], [place] [phone]     [facility]    [facility], [place], [place]  [phone]       [facility]      [facility] [address]  [place]) [phone]- [phone]              [facility] - [phone] [phone]

## 2019-02-02 NOTE — Progress Notes (Signed)
[facility] Psychiatric Evaluation    Date/Time:   02/02/2019  10:33 AM  Name:  Kathy Brewer, Kathy Brewer  MRN:    [id number]  Age:   23 y.o.  DOB:   [birth year]  Sex:  female    CHIEF COMPLAINT  "Anxiety"    HISTORY OF PRESENT ILLNESS  This visit has been conducted via [employer] room (Patient at [facility])to minimize exposure to COVID 19.    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  Since then she started having panic attacks and received Xanax 0.25 mg as needed by her PCP for 1 month supply.  She stopped taking Xanax 2 weeks ago.  She developed severe panic attack yesterday and had to go to ED at [facility].    She recently received Ativan 1 mg #4 tabs prescription by her PCP.  Reports that she is still having waves of panic attacks which lasted up to a few hours on daily basis.  Reported symptoms include, "chest pain/palpitations/shortness of breath/tremors/sweating" sometimes out of the blue.  She denies history of any mental trauma or bullying in the past.  Reports that she is not able to eat or drink due to acid reflux leading to anxiety attacks.  She is waking up with anxiety in the middle of night or early in the morning.  She was on BuSpar by her PCP while she was in [place] for a few weeks which was a brief trial.  She has been losing weight due to poor appetite and sleep.  She lost 25 pounds over the past few weeks.  She was also having breakouts while taking Protonix and had to stop taking it.    Patient was tearful throughout the encounter, overwhelmed and preoccupied with GI and anxiety symptoms leading to anticipatory anxiety.  She also worries about medication adverse effects.  She is requesting intensive treatment program to be monitored while she  is starting new medication.  Reports that she is not functioning well at home even for showering or eating.  Her mother has to help her.  She was wearing a hair cover scarf as her hair has been not done for the past 2 months.  She denies active or passive suicidal thoughts at this time.  Her depressive symptoms meet criteria for adjustment disorder with depressed mood as it is secondary to severe anxiety and acid reflux symptoms.      PSYCHIATRIC REVIEW OF SYMPTOMS  Subjective Mood: "Anxiety/sad"   Sleep: Onset difficulty   Appetite/Weight: Decreased / Known loss of  25 lbs over  a few weeks   Focus & Concentration: Poor Focus/Distractible   Energy Level: Decreased   Delusions:  Denies Delusional Content   Hallucinations: Denies any Hallucinosis   Suicide or Self-Injury?: No   Homicide or Violence?: No   Access to Guns?: No       Psychiatric History:        Current Mental Health Provider(s) PCP   Previous Mental Health Provider(s)? 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    Allergies   Allergen Reactions    Singulair [Montelukast]      Numbness in fingers and toes    Penicillins Rash          PSYCHIATRIC SPECIALITY & MENTAL STATUS EXAM  Vital Signs There were no vitals taken for this visit.   General Appearance  casual/appropriate   Muskuloskeletal No weakness, abnormal movements, or other impairments   Gait/Station  No impairments to gait/station   Speech Normal Rate, Rythym, & Volume   Thought Process Logical, Linear, Goal Directed   Associations  Preoccupied    Thought Content No evidence of homicidal, suicidal, violent, or delusional thought content   Perceptions No evidence of hallucinosis   Judgment No Impairment   Insight  Fair   LOC/Orientation A&O x 4, Sensorium Clear   Memory Intact   Attention & Concentration Normal   Fund of Knowledge Adequate given patient age, socioeconomic status, and educational level   Language Fluent with no impairments in comprehension or expression   Mood Depressed, Anxious,  Overwhelmed   Affect Anxious, Limited / Constricted, Tearful       ASSESSMENT  23 y.o. female with severe GERD/Asthma, history of mild anxiety but no official mental health diagnosis presented with severe panic level anxiety attacks on and off for the past few months since February 2020 after she was medically admitted for severe GERD symptoms. Denies psychiatric hospitalizations/ history of psychosis/SI/SAs or significant substance abuse.      There is no safety concerns at this time as patient is not suicidal/homicidal/psychotic.   Reviewed risks/benefits of treatment options as well as potential side effects of medications and medication interactions.     Patient verbally consented to the prescribed medications due to the nature of tele video visit via [employer] Zoom Room to minimize exposure to COVID19.  Patient agrees with safety plan which includes calling [facility] call center or 911 or go to ER if needed and to follow up with treatment recommendations.  All  questions answered and concerns addressed.       ENCOUNTER DIAGNOSIS  1. 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 Insomnia due to other mental disorder         PLAN  Treatment options and alternatives reviewed with patient, along  with detailed discussion of medication(s) and side effects, and they concur with following plan:    Medications:   Take Remeron 15 mg nightly   Atarax 12.5-25 mg every 6 hours as needed for anxiety/itchiness/sleep    Therapies:   Psychotherapy: [place] to arrange thru their Insurance Panel, [facility], or other external referral   PHP: Agrees to PHP, has (or will make) an intake appointment    Labs/Other:   Patient will benefit from group psychotherapy and close medication monitoring at [facility] program secondary to severity of anxiety symptoms, unable to care for herself with ADLs, worrying about medication adverse effects    DISPOSITION & FOLLOW-UP   Discharge to: self-care   Follow-up: Referred to PCCM for resources   Return to [facility]    _____________________________________________  Harriett Rush, MD

## 2019-02-02 NOTE — Progress Notes (Signed)
[facility]) Walk-In Evaluation    Date/Time:  02/02/2019  12:59 PM  Patient Name: Kathy Brewer, Kathy Brewer  MRN:  [id number]  Age: 23 y.o.  DOB: 04-27-1996    PART-1  TRIAGE     Vital Signs:     Vitals:    02/02/19 1304   BP: 118/69   Pulse: 98   Resp: 16   Temp: 97.3 F (36.3 C)   SpO2: 97%           Presenting Concerns:      Are you being referred by a psychiatrist, emergency room behavioral health provider, or therapist? Yes, describe councilor     If no, have you previously been evaluated by a doctor or a therapist for mental health concerns?     Why are you here today? "Im very anxious and its so severe that I can't eat or drink. I just get really sad. I keep hyperventilating. Open to medication."       CSSRS Questions:          C-SSRS Suicidal Ideation Severity    Ask questions that are in italics for the past month. yes no   1)    Wish to be dead  []    [x]        In the past month, Have you wished you were dead or wished you could go to sleep and not wake up?     2)   Current suicidal thoughts  []    [x]        In the past month, Have you actually had any thoughts of killing yourself?     IF YES TO 2, ASK 3, 4,5. IF NO TO 2, GO TO QUESTION 6.         3)   Suicidal thoughts w/ Method (w/no specific Plan or Intent or act)  []   []        In the past month, Have you been thinking about how you might do this?        (I.e. thoughts to overdose; etc.)          4)    Suicidal Intent without Specific Plan  []    []        In the past month, Have you had these thoughts and had some intention of acting on them?    (I.e. having thoughts to overdose with some intent to act on these thoughts)           5)    Intent with Plan  []    []        In the past month, Have you started to work out or worked out the details of how to kill yourself? Do you intend to carry out this plan?    (I.e. plan to overdose on Tuesday with intent to do so on Tuesday vs. General thought for Tuesday)        6) C-SSRS Suicidal  Behavior: "Have you ever done anything, started to do anything, or prepared to do anything to end your life? Lifetime Lifetime      []    [x]       3-2 months ago  1 month ago     If YES Was it within the past 3 months?  []   []    [x]       ASK IF APPROPRIATE BASED OFF CSSR:   Are you open to hospitalization?   Is there a plan to harm yourself?       Safety  Questions:      Do you have any Current or recent Homicidal ideation/intent or plan:No    Do you have any auditory or visual hallucinations? No - "When im about to fall asleep I see things"   Do you have reasons for living? No    Are you feeling unsafe today? No      Psychiatric History:        Current Mental Health Provider(s) No    Previous Mental Health Provider(s)? 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 I stopped taking it two weeks ago but I took one yesterday"        Disposition:      Would you be able to tell us if you decided to leave IPAC today without being seen? Yes    Pt. will be seen by next available provider.           Annlee Glandon

## 2019-02-02 NOTE — Progress Notes (Signed)
PCCM spoke with Pt and Pt's mother following IPAC visit. Per front desk team member France Ravens B who recieved an e-mail from [employer]; [employer] is not accepted by PHP.   PCCM contacted Pt via telephone to inform Pt of this information. PCCM informed Pt that she will not be able to participate in PHP unless she pay out of pocket. PCCM advised Pt to contact her health insurance for a list of in-network programs, providers, and explanation of benefits for [facility]. Pt reports she understands, and will do so. PCCM informed Pt that PCCM will be e-mailing Pt BH resources next week. Pt's mother also asked PCCM for a work note that [facility] clinican Consuella Lose was to provide. Pt was then transferred to [facility] clinician.      Donivan Scull, [facility]    Behavioral Health Care Navigator

## 2019-02-02 NOTE — Progress Notes (Signed)
[facility] Psychiatric Assessment Center [facility]) New Patient    Date/Time:  02/02/2019  9:26 AM  Patient Name: Kathy Brewer, Kathy Brewer  MRN:  [id number]  Age: 23 y.o.  DOB: [birth year]    PART-1  NEW PATIENT  PHONE TRIAGE        Presenting Concerns:      Why are you looking for IPAC services? I'm just really anxious all the time.    Are you being referred by a psychiatrist, emergency room behavioral health provider, or therapist? No    IF NO,  have you previously been evaluated by a doctor or a therapist for mental health concerns? No      CSSRS Questions:          C-SSRS Suicidal Ideation Severity    Ask questions that are in italics for the past month. yes no   1)    Wish to be dead  []    [x]        In the past month, Have you wished you were dead or wished you could go to sleep and not wake up?     2)   Current suicidal thoughts  []    [x]        In the past month, Have you actually had any thoughts of killing yourself?     IF YES TO 2, ASK 3, 4,5. IF NO TO 2, GO TO QUESTION 6.         3)   Suicidal thoughts w/ Method (w/no specific Plan or Intent or act)  []   []        In the past month, Have you been thinking about how you might do this?        (I.e. thoughts to overdose; etc.)          4)    Suicidal Intent without Specific Plan  []    []        In the past month, Have you had these thoughts and had some intention of acting on them?    (I.e. having thoughts to overdose with some intent to act on these thoughts)           5)    Intent with Plan  []    []        In the past month, Have you started to work out or worked out the details of how to kill yourself? Do you intend to carry out this plan?    (I.e. plan to overdose on Tuesday with intent to do so on Tuesday vs. General thought for Tuesday)        6) C-SSRS Suicidal Behavior: "Have you ever done anything, started to do anything, or prepared to do anything to end your life? Lifetime Lifetime      []    [x]       3-2 months ago  1 month ago     If YES Was it within the past 3  months?  []   []    [x]       [ IF ORANGE OR RED, COMPLETE IPAC STEP 3 IN THIS SPACE. ]        Safety Questions:      Do you have any current suicidal ideation? No    Do you have any Current or recent Homicidal ideation/intent or plan:No    Do you have any auditory or visual hallucinations? No  (Have experienced possible hallucinations when taken certain medicines in the past)    Are you feeling unsafe today? No  Do you have reasons for living? Yes, describe Family      COVID-19 Screening Questions:       Do you have any symptoms of fever, cough, or shortness of breath? No, except for shortness of breath when in midst of panic attack    IF YES: Please seek treatment with your PCP or Urgent Care. IF YES AND ACUTE SAFETY RISK, Please visit the Emergency room.         Televideo Screening Questions:     "At this time, we are conducting all psychiatric and clinical visits via televideo."     Patient needs to be seen in office due to [place] address.    During your visit, do you have access to your pharmacy information and/or medicine bottles? Yes    Can you provide your pharmacy phone number during the visit? Yes      Psychiatric History:        Current Mental Health Provider(s) No   Previous Mental Health Provider(s)? 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 No  Do you require any language services? No          Disposition:      IPAC Appointment: Yes, [facility] (In Office)   Date: 02/02/19   Time: 1:15pm    Referral to PCP or Urgent Care: No    Referral to the Emergency Department: No        Consuella Lose, Ph.D.

## 2019-02-05 ENCOUNTER — Encounter (HOSPITAL_BASED_OUTPATIENT_CLINIC_OR_DEPARTMENT_OTHER): Payer: Self-pay | Admitting: Psychiatry

## 2019-02-05 ENCOUNTER — Encounter (HOSPITAL_BASED_OUTPATIENT_CLINIC_OR_DEPARTMENT_OTHER): Payer: Self-pay

## 2019-02-05 ENCOUNTER — Telehealth (HOSPITAL_BASED_OUTPATIENT_CLINIC_OR_DEPARTMENT_OTHER): Payer: No Typology Code available for payment source

## 2019-02-05 DIAGNOSIS — F419 Anxiety disorder, unspecified: Secondary | ICD-10-CM

## 2019-02-05 NOTE — Progress Notes (Signed)
PCCM provided Pt with a list of in-network providers. PCCM encouraged Pt to contact BH with any further questions/concerns.     Donivan Scull, [facility]    [facility] Psychiatrists      [facility]   [address]  [place], [place] [address]  [phone]   [facility], [facility]  Dr. Jolaine Click [address]  [place], [place] [address]  [phone]   [facility], [place]   [address]  [place], [place] [address]  [phone]   [facility]   [address] [place], [place] [address]  [phone]   [facility]   [id number] [facility] [address]   [address]  [place], [place] [address]  [phone]   [facility]  Dr. Jones Broom [address]  [place], [place] [address]  [phone]  *Fastest way to schedule an appointment is to go to [email or web address] and complete questionnaire   Dr. Juliann Pulse  Psychiatrist    Address: [address]  [place], [place] [address]  Phone: [phone]   [facility] in Psychiatry  (psych & therapy)  *art therapy  [address] [place], [place] [address]  [phone]   [facility] [address] Julious Oka [place], [place] [address]    [phone]   [facility] [place] [address] [place] [place], [place] [address]  Phone: [phone]     [facility]   Address: [address] [place], [place], [place] [address]  Phone: [phone]   [facility] [address]  [place], [place] [address]  [phone]  *Also sees Patients in [employer] address             [place] [facility]rapists    [facility]  Wendy Velazco-Weiss  Clinical Social Work/Therapist, [place], LCSW-C Address: [address], [place], [place] [address]  Phone: [phone]  Appointments: [facility]   Address: [address] [place], [place] [address]  Hours:  Open ? Closes 7PM                              Phone: [phone]     [facility]   Address: [address] [place], [place] [address]  Phone: [phone]     [facility]   Address: [address], [place], [place] [address]  Hours:  Open ? Closes 8PM                              Phone: [phone]     [facility]  [facility]  Clinical Social Work/Therapist,  LCSW Address: [address] [place], [place] [address]  Phone: [phone]   [facility]   Address: [address] [place], [place], [place] [address]  Phone: [phone]     [facility]  Lyndee Leo  Clinical Social Work/Therapist,  LCSW   Address: [address]  [place], [place] [address]  Phone: [phone]         [facility]     [facility]  Psychiatric Nurse Practitioner, PMHNP, [facility], CCM    MEDICATION MANAGEMENT   Address: [address], [place], [place] [address]  Phone: [phone]   [facility]   Address: [address] [place], [place] [address]  Phone: [phone]   [facility]  Deon Eliseo Squires  Counselor, [place], [place], [place]     Address: [address] [place], [place] [address]  Phone: [phone]   [employer]   Address: [address], [place], [place] [address]  Phone: [phone]

## 2019-02-05 NOTE — Progress Notes (Signed)
[facility] Psychiatric Assessment Center [facility]) Follow-Up     Date/Time:  02/05/2019  10:24 AM  Patient Name: Kathy Brewer, Kathy Brewer  MRN:  [id number]  Age: 23 y.o.  DOB: 1996-07-31    PART-1  FOLLOW-UP PHONE TRIAGE        Presenting Concerns     Why are you looking for IPAC services? Hydroxyzine is not working, unsure about effectiveness of Remeron.  Frustrated because had mentioned to provider that had previously tried Hydroxyzine and it did not work for me. (IPAC Visit on 02/02/19)    Has anything changed since your last visit? No    Are you connected to any current mental health services since your last visit? No      CSSRS Questions:         C-SSRS Suicidal Ideation Severity      Ask questions that are in the italics for the past month... yes no   1)    Wish to be dead  []    [x]        In the past month, Have you wished you were dead or wished you could go to sleep and not wake up?     2)   Current suicidal thoughts  []    [x]        In the past month, Have you actually had any thoughts of killing yourself?     IF YES TO 2, ASK 3, 4,5. IF NO TO 2, GO TO QUESTION 6.         3)   Suicidal thoughts w/ Method (w/no specific Plan or Intent or act)  []   []        In the past month, Have you been thinking about how you might do this?    (I.e. might overdose; etc.)      4)    Suicidal Intent without Specific Plan  []    []        In the past month, Have you had these thoughts and had some intention of acting on them?    (I.e. having thoughts to overdose with some intent to act on these thoughts)     5)    Intent with Plan  []    []        In the past month, Have you started to work out or worked out the details of how to kill yourself? Do you intend to carry out this plan?    (I.e. plan to overdose on Tuesday with intent to do so on Tuesday vs. General thought for Tuesday)      6) C-SSRS Suicidal Behavior: "Have you ever done anything, started to do anything, or prepared to do anything to end your life? Lifetime Lifetime      []    [x]        3-2 monthago  1 month ago     If YES Was it within the past 3 months?  []   []    [x]       [ IF ORANGE OR RED, COMPLETE IPAC STEP 3 IN THIS SPACE. ]       Safety Questions:      Do you have any current suicidal ideation? No    Do you have any Current or recent Homicidal ideation/intent or plan:No    Do you have any auditory or visual hallucinations? No    Are you feeling unsafe today? No     Do you have reasons for living? Yes, describe My Family      COVID-19 Screening Questions:  Do you have any symptoms of fever, cough, or shortness of breath? No    IF YES: Please seek treatment with your PCP or Urgent Care. IF YES AND ACUTE SAFETY RISK, Please visit the Emergency room.         Televideo Screening Questions:     "At this time, we are conducting all psychiatric and clinical visits via televideo."     Do you have access to an electronic device that has access to video if needed? Yes:    Do you have access to a private location for a televideo visit? Yes    Do you have access to headphones? Yes    Do you have MyChart set up? Yes MyChart will be used to provide information regarding the televisit, including registration forms.     During your visit, do you have access to your pharmacy information and/or medicine bottles? Yes    Can you provide your pharmacy phone number during the visit? Yes       Substance Use:      Has there been any recent substance use? No  Has there been any recent alcohol use: No  Are you currently experiencing any withdrawal symptoms? No      Accommodations:      Do you require any hard of hearing services? No  Do you require any language services? No      Disposition:      IPAC Appointment:  Lenna Gilford (Video)   Date: 02/06/19   Time: 9:45am    Referral to PCP or Urgent Care:  No    Referral to the Emergency Department:  No        Consuella Lose, Ph.D.

## 2019-02-05 NOTE — Progress Notes (Signed)
Returned patient's call regarding scheduling for IPAC follow-up [phone]); however, VM only allowed for SMS notification. Call center number was provided as call back number.    Consuella Lose, Ph.D.

## 2019-02-06 ENCOUNTER — Telehealth (INDEPENDENT_AMBULATORY_CARE_PROVIDER_SITE_OTHER)
Payer: No Typology Code available for payment source | Admitting: Psychiatric - Mental Health Nurse Practitioner (Across the Lifespan)

## 2019-02-06 ENCOUNTER — Encounter (HOSPITAL_BASED_OUTPATIENT_CLINIC_OR_DEPARTMENT_OTHER): Payer: Self-pay | Admitting: Psychiatric - Mental Health Nurse Practitioner (Across the Lifespan)

## 2019-02-06 ENCOUNTER — Encounter (HOSPITAL_BASED_OUTPATIENT_CLINIC_OR_DEPARTMENT_OTHER): Payer: Self-pay

## 2019-02-06 DIAGNOSIS — F411 Generalized anxiety disorder: Secondary | ICD-10-CM

## 2019-02-06 DIAGNOSIS — F410 Panic disorder [episodic paroxysmal anxiety] without agoraphobia: Secondary | ICD-10-CM

## 2019-02-06 DIAGNOSIS — F331 Major depressive disorder, recurrent, moderate: Secondary | ICD-10-CM

## 2019-02-06 MED ORDER — CLONAZEPAM 0.5 MG PO TABS
0.5000 mg | ORAL_TABLET | Freq: Two times a day (BID) | ORAL | 0 refills | Status: DC
Start: 2019-02-06 — End: 2019-03-07

## 2019-02-06 MED ORDER — SERTRALINE HCL 50 MG PO TABS
ORAL_TABLET | ORAL | 1 refills | Status: DC
Start: 2019-02-06 — End: 2019-03-07

## 2019-02-06 NOTE — Progress Notes (Signed)
[facility] Psychiatric Follow-Up    Date/Time:   02/06/2019 / 11:15 AM  Name:  Kathy Brewer, Kathy Brewer  MRN:    [id number]  Age:   23 y.o.  DOB:   Feb 02, 1996  Sex:  female     Originating site:  Pt's Home  Distant site:  Provider's Home Office    Verbal consent has been obtained from the patient to conduct a video/phone conference (Vidyo) visit encounter to minimize exposure to COVID-19: Yes    CHIEF COMPLAINT  "Hydroxyzine is not working"    HISTORY OF PRESENT ILLNESS  23 y/o female pt with PPHx of Adjustment d/o depressed type, Anxiety, and Insomnia who presents today with complaint of worsening anxiety and panic attacks.  Pt reports waking up during the night with racing heart and feeling worried.  Pt reports being unable to eat due to difficulty swallowing, which causes anxiety and crying episodes.  Pt says she is worried that nothing will ever help allay her anxiety. She reports panic attacks characterized by SOB, Chest discomfort, and a feeling of doom.  Pt says the panic attacks first began in response to her GERD symptoms, but now are occurring more spontaneously and waking her up at night.  Pt says her mother has to lay beside her while she tries to fall asleep, because she is anxious and fears having a panic attack.  She says that once asleep she sleeps approximately 6 hours.  Appetite has been decreased due to GERD and mood, with a 25 lb wt loss over the past month.  Low energy.  Difficulty focusing and concentrating on school work.  She is worried because this is finals week.  She says her symptoms of reflux [id number] sensation in her stomach and throat, "coughing up foam and feeling like I can't breathe".  Symptoms of GERD started in February 2020, followed by anxiety and panic.  Pt says she was hospitalized for one week.  She f/u with a GI specialist, who performed an EGD, and pt says she was told she had mild inflammation.  She was started on Protonix, but pt has since stopped due to  ineffectiveness and acne.  Pt says she never f/u with the GI specialist to discuss side effects and alternative therapy.      Today pt reports her mood as "I feel really bad because I can't function.  I feel shaky all of the time".  Pt reports worry about school, and what her symptoms might do to her.  She is worried about fainting again, as she has had two syncopal episodes; most recent on last week while lying on a stretcher in the hospital.  Pt says she has had thoughts of it being better if she were not here so she would not have to experience anxiety anymore, however she says she does not want to kill herself.  Pt denies history of self harm. Denies HI/AVH/Delusons.      Medication trials:  Pt reports a failed trial of Hydroxyzine in the past, when symptoms first began.  She says the latest trial was also ineffective.  Pt says she only took Mirtazapine for two days, before stopping, because her anxiety level was so high she decided to an Ativan for better control.  She reports taking an Ativan from prescription received following an ED visit.  Pt says she has been taking one Ativan/day for the past 2-3 days with good, short term response.  She was afraid to combine the Ativan with Mirtazapine.  Pt also reports a trial of Buspar, for two weeks.  She says it just made her sleep.  She says that her doctor recommended Paxil, but after doing some research she decided there were too many side effects, and refused the medication.      Pt reports symptoms of OCD in her past; having to check the locks on doors several times a day.  Repeatedly checking to make sure appliances and other electronics are turned off.  Never treated.  Pt says she has had underlying anxiety since high school, but never severe.  Denies panic attacks before February 2020.          Current/Home Medications    HYDROXYZINE (ATARAX) 25 MG TABLET    Take 1 tablet (25 mg total) by mouth every 6 (six) hours as needed for Itching or Anxiety May reduce  to 12.5 mg in day time.    MIRTAZAPINE (REMERON) 15 MG TABLET    Take 1 tablet (15 mg total) by mouth nightly    PANTOPRAZOLE (PROTONIX) 40 MG TABLET    Take 40 mg by mouth daily           REVIEW OF SYSTEMS   Constitutional: Denies pain.  Some discomfort with GERD sx's.  Pt reports 25 lb wt. loss over the past month due to decreased appetite  Gastrointestinal: GERD:  burning sensation in stomach and esophagus; difficulty swallowing, coughing during meals  All Other Systems Reviewed and Are Negative      PSYCHIATRIC SPECIALITY & MENTAL STATUS EXAM  Vital Signs There were no vitals taken for this visit.   General Appearance Neatly groomed, appropriately dressed and adequately nourished   Muskuloskeletal No weakness, abnormal movements, or other impairments   Speech Normal Rate, Rythym, & Volume   Thought Process Logical, Linear, Goal Directed   Associations Intact    Thought Content No evidence of homicidal, suicidal, violent, or delusional thought content   Perceptions No evidence of hallucinosis   Judgment Pt not taking medications as prescribed   Insight  Good   LOC/Orientation A&O x 4, Sensorium Clear   Memory Intact   Attention & Concentration Normal   Mood Depressed, Anxious   Affect Anxious, Tearful       ASSESSMENT  23 y.o. female female pt with PPHx of Adjustment d/o depressed type, Anxiety, and Insomnia who presents today with complaint of worsening anxiety and panic attacks.  Pt presents with depressed and anxious mood and affect.  Tearful during much of the interview.  Pt endorsing symptoms of anxiety and panic.  She meets criteria for GAD, panic attack, and MDD.  History of OCD.  See HPI. Patient has anxiety about taking prescription medications.  Focused on potential side effects.  Pt requested hospital admission so that she can be monitrored for adverse reactions to new medications.  Pt educated about the purpose of hospitalization and that she does not meet criteria.  Pt encouraged to reach out to [facility]  with any concerns, or questions that she may have while starting new medications.  She verbalized her understanding.      Pt educated about SSRI's, their side effects, and time to onset.  Pt agreed to trial of Sertraline to help with depression, anxiety, and panic.  Pt will also be given a short term course of a long acting BNZ to bridge onto SSRI.  Pt denies history of drug abuse/dependency issues.      Encounter Diagnoses   Name Primary?    GAD (generalized  anxiety disorder) Yes    Panic attack     MDD (major depressive disorder), recurrent episode, moderate          PLAN  Treatment options and alternatives reviewed with patient, along with detailed discussion of medication(s) and side effects, and they concur with following plan:    Medications:  In lieu of written consent, pt gave verbal consent to order psychopharmacology agents as listed below:     Stop Mirtazapine   Start Sertraline 50 mg tabs.  For the 1st 7 days take 1/2 tab po qam.  If tolerated increase to 1 tab po qam, for mood. #30. 1RF.   Start  Clonazepam 0.5 mg po bid, for panic attacks. #60.  No RF's.     Therapies:   Psychotherapy: With pt's permission request for referral forwarded to care navigator.   Partial Hospital Program: PHP not clinically relevant at this time    Labs/Other:   N/A    I spent a total of 40 minutes caring for the patient today, with more than 50% spent counseling the patient about their condition, goals and treatment plan.     DISPOSITION & FOLLOW-UP   Discharge to: Self-care   Follow-up: Pt to find outpatient psychiatrist. With pt's permission referral given to care navigator.   Return to [facility] PRN    _____________________________________________  Thurmond Butts, NP

## 2019-02-06 NOTE — Patient Instructions (Signed)
MEDICATION INSTRUCTIONS                    1. Take all medications as prescribed; do not stop medications or change dosages without talking to your provider(s).  2. Abstain from alcohol and/or illegal drugs as they interfere with psychiatric medications.   3. Immediately go to the nearest emergency department or call 911 if you have any thoughts of wanting to harm yourself or others, or for any other crisis.  4. Consult with your pharmacist if you questions about your medications, their side effects or possible interactions with other medications you take.      FOLLOW-UP CARE APPOINTMENTS    1. PSYCHIATRIC MEDICATION MANAGEMENT: Please contact [facility] if you have any intolerable medication side effects.   2. PSYCHOTHERAPY: If you do not already have a therapist, find one by contact your insurance provider for a list of in-network providers. Time-limited, psychotherapy programs are available through [facility] by calling [phone]. Another source for therapists is http://www.psychology-today.com/.  3. Return to [facility] anytime if necessary (but not for routine matters like refills). [facility] Walk-in hours are 8a-4p. Monday thru Friday, excluding Thanksgiving, Christmas and New Years Day. We close early at 3pm on the first Wednesday of each month for staff meetings.      ABOUT YOUR MEDICATIONS:    Sertraline tablets  Brand Name: Zoloft  What is this medicine?  SERTRALINE (SER tra leen) is used to treat depression. It may also be used to treat obsessive compulsive disorder, panic disorder, post-trauma stress, premenstrual dysphoric disorder (PMDD) or social anxiety.  How should I use this medicine?  Take this medicine by mouth with a glass of water. Follow the directions on the prescription label. You can take it with or without food. Take your medicine at regular intervals. Do not take your medicine more often than directed. Do not stop taking this medicine suddenly except upon the advice of your doctor. Stopping this medicine  too quickly may cause serious side effects or your condition may worsen.  A special MedGuide will be given to you by the pharmacist with each prescription and refill. Be sure to read this information carefully each time.  Talk to your pediatrician regarding the use of this medicine in children. While this drug may be prescribed for children as young as 7 years for selected conditions, precautions do apply.  What side effects may I notice from receiving this medicine?  Side effects that you should report to your doctor or health care professional as soon as possible:   allergic reactions like skin rash, itching or hives, swelling of the face, lips, or tongue   anxious   black, tarry stools   changes in vision   confusion   elevated mood, decreased need for sleep, racing thoughts, impulsive behavior   eye pain   fast, irregular heartbeat   feeling faint or lightheaded, falls   feeling agitated, angry, or irritable   hallucination, loss of contact with reality   loss of balance or coordination   loss of memory   painful or prolonged erections   restlessness, pacing, inability to keep still   seizures   stiff muscles   suicidal thoughts or other mood changes   trouble sleeping   unusual bleeding or bruising   unusually weak or tired   vomiting  Side effects that usually do not require medical attention (report to your doctor or health care professional if they continue or are bothersome):   change in  appetite or weight   change in sex drive or performance   diarrhea   increased sweating   indigestion, nausea   tremors  What may interact with this medicine?  Do not take this medicine with any of the following medications:   cisapride   dofetilide   dronedarone   linezolid   MAOIs like Carbex, Eldepryl, Marplan, Nardil, and Parnate   methylene blue (injected into a vein)   pimozide   thioridazine  This medicine may also interact with the following medications:   alcohol   amphetamines    aspirin and aspirin-like medicines   certain medicines for depression, anxiety, or psychotic disturbances   certain medicines for fungal infections like ketoconazole, fluconazole, posaconazole, and itraconazole   certain medicines for irregular heart beat like flecainide, quinidine, propafenone   certain medicines for migraine headaches like almotriptan, eletriptan, frovatriptan, naratriptan, rizatriptan, sumatriptan, zolmitriptan   certain medicines for sleep   certain medicines for seizures like carbamazepine, valproic acid, phenytoin   certain medicines that treat or prevent blood clots like warfarin, enoxaparin, dalteparin   cimetidine   digoxin   diuretics   fentanyl   isoniazid   lithium   NSAIDs, medicines for pain and inflammation, like ibuprofen or naproxen   other medicines that prolong the QT interval (cause an abnormal heart rhythm)   rasagiline   safinamide   supplements like St. John's wort, kava kava, valerian   tolbutamide   tramadol   tryptophan  What if I miss a dose?  If you miss a dose, take it as soon as you can. If it is almost time for your next dose, take only that dose. Do not take double or extra doses.  Where should I keep my medicine?  Keep out of the reach of children.  Store at room temperature between 15 and 30 degrees C (59 and 86 degrees F). Throw away any unused medicine after the expiration date.  What should I tell my health care provider before I take this medicine?  They need to know if you have any of these conditions:   bleeding disorders   bipolar disorder or a family history of bipolar disorder   glaucoma   heart disease   high blood pressure   history of irregular heartbeat   history of low levels of calcium, magnesium, or potassium in the blood   if you often drink alcohol   liver disease   receiving electroconvulsive therapy   seizures   suicidal thoughts, plans, or attempt; a previous suicide attempt by you or a family member   take  medicines that treat or prevent blood clots   thyroid disease   an unusual or allergic reaction to sertraline, other medicines, foods, dyes, or preservatives   pregnant or trying to get pregnant   breast-feeding  What should I watch for while using this medicine?  Tell your doctor if your symptoms do not get better or if they get worse. Visit your doctor or health care professional for regular checks on your progress. Because it may take several weeks to see the full effects of this medicine, it is important to continue your treatment as prescribed by your doctor.  Patients and their families should watch out for new or worsening thoughts of suicide or depression. Also watch out for sudden changes in feelings such as feeling anxious, agitated, panicky, irritable, hostile, aggressive, impulsive, severely restless, overly excited and hyperactive, or not being able to sleep. If this happens, especially at  the beginning of treatment or after a change in dose, call your health care professional.  Kathy Brewer may get drowsy or dizzy. Do not drive, use machinery, or do anything that needs mental alertness until you know how this medicine affects you. Do not stand or sit up quickly, especially if you are an older patient. This reduces the risk of dizzy or fainting spells. Alcohol may interfere with the effect of this medicine. Avoid alcoholic drinks.  Your mouth may get dry. Chewing sugarless gum or sucking hard candy, and drinking plenty of water may help. Contact your doctor if the problem does not go away or is severe.  NOTE:This sheet is a summary. It may not cover all possible information. If you have questions about this medicine, talk to your doctor, pharmacist, or health care provider. Copyright 2020 Elsevier      Clonazepam tablets  Brand Names: Ceberclon, Klonopin  What is this medicine?  CLONAZEPAM (kloe NA ze pam) is a benzodiazepine. It is used to treat certain types of seizures. It is also used to treat panic  disorder.  How should I use this medicine?  Take this medicine by mouth with a glass of water. Follow the directions on the prescription label. If it upsets your stomach, take it with food or milk. Take your medicine at regular intervals. Do not take it more often than directed. Do not stop taking or change the dose except on the advice of your doctor or health care professional.  A special MedGuide will be given to you by the pharmacist with each prescription and refill. Be sure to read this information carefully each time.  Talk to your pediatrician regarding the use of this medicine in children. Special care may be needed.  What side effects may I notice from receiving this medicine?  Side effects that you should report to your doctor or health care professional as soon as possible:   allergic reactions like skin rash, itching or hives, swelling of the face, lips, or tongue   breathing problems   confusion   loss of balance or coordination   signs and symptoms of low blood pressure like dizziness; feeling faint or lightheaded, falls; unusually weak or tired   suicidal thoughts or mood changes  Side effects that usually do not require medical attention (report to your doctor or health care professional if they continue or are bothersome):   dizziness   headache   tiredness   upset stomach  What may interact with this medicine?  Do not take this medication with any of the following medicines:   narcotic medicines for cough   sodium oxybate  This medicine may also interact with the following medications:   alcohol   antihistamines for allergy, cough and cold   antiviral medicines for HIV or AIDS   certain medicines for anxiety or sleep   certain medicines for depression, like amitriptyline, fluoxetine, sertraline   certain medicines for fungal infections like ketoconazole and itraconazole   certain medicines for seizures like carbamazepine, phenobarbital, phenytoin, primidone   general  anesthetics like halothane, isoflurane, methoxyflurane, propofol   local anesthetics like lidocaine, pramoxine, tetracaine   medicines that relax muscles for surgery   narcotic medicines for pain   phenothiazines like chlorpromazine, mesoridazine, prochlorperazine, thioridazine  What if I miss a dose?  If you miss a dose, take it as soon as you can. If it is almost time for your next dose, take only that dose. Do not take double or extra  doses.  Where should I keep my medicine?  Keep out of the reach of children. This medicine can be abused. Keep your medicine in a safe place to protect it from theft. Do not share this medicine with anyone. Selling or giving away this medicine is dangerous and against the law.  This medicine may cause accidental overdose and death if taken by other adults, children, or pets. Mix any unused medicine with a substance like cat litter or coffee grounds. Then throw the medicine away in a sealed container like a sealed bag or a coffee can with a lid. Do not use the medicine after the expiration date.   Store at room temperature between 15 and 30 degrees C (59 and 86 degrees F). Protect from light. Keep container tightly closed.  What should I tell my health care provider before I take this medicine?  They need to know if you have any of these conditions:   an alcohol or drug abuse problem   bipolar disorder, depression, psychosis or other mental health condition   glaucoma   kidney or liver disease   lung or breathing disease   myasthenia gravis   Parkinson's disease   porphyria   seizures or a history of seizures   suicidal thoughts   an unusual or allergic reaction to clonazepam, other benzodiazepines, foods, dyes, or preservatives   pregnant or trying to get pregnant   breast-feeding  What should I watch for while using this medicine?  Tell your doctor or health care professional if your symptoms do not start to get better or if they get worse.  Do not stop taking  except on your doctor's advice. You may develop a severe reaction. Your doctor will tell you how much medicine to take.  You may get drowsy or dizzy. Do not drive, use machinery, or do anything that needs mental alertness until you know how this medicine affects you. To reduce the risk of dizzy and fainting spells, do not stand or sit up quickly, especially if you are an older patient. Alcohol may increase dizziness and drowsiness. Avoid alcoholic drinks.  If you are taking another medicine that also causes drowsiness, you may have more side effects. Give your health care provider a list of all medicines you use. Your doctor will tell you how much medicine to take. Do not take more medicine than directed. Call emergency for help if you have problems breathing or unusual sleepiness.  The use of this medicine may increase the chance of suicidal thoughts or actions. Pay special attention to how you are responding while on this medicine. Any worsening of mood, or thoughts of suicide or dying should be reported to your health care professional right away.  NOTE:This sheet is a summary. It may not cover all possible information. If you have questions about this medicine, talk to your doctor, pharmacist, or health care provider. Copyright 2020 Elsevier              [place] [facility]    [facility] Call Center  - [phone] 24/7/365  For admissions and screening for all [facility] Services, including:   Comprehensive Addiction Treatment Services (CATS) Inpatient Detox, IOP Intensive Outpatient Programs   Partial Hospitalization Program (PHP), Outpatient psychiatry, Outpatient counseling        [facility]  [facility] Psychiatric Assessment [facility]  [address]  [place], [place] [address] For Urgent Adult (18 and Over) Psychiatric Assessments [phone]  [facility]  [address]  [place], [place] [address]   For Child and Youth (Under 18) Mental Health and Substance Abuse Outpatient  Services [phone]   [facility] [place]  [address]  [place], [place] [address] For Non-Urgent Psychiatric Appointments: [phone]   [facility]   [facility]  [address]  [place], [place] [address]   For Non-Urgent Psychiatric Appointments: [phone]   [facility]  [address] [place], [place] [address]   For Non-Urgent Psychiatric Appointments: [phone]   [facility]- [address]  [address]  [place], [place] [address]   For Non-Urgent Psychiatric Appointments: [phone]   [facility]  [address]   [place], [place] [address]   For Non-Urgent Psychiatric Appointments: [phone]         COMMUNITY RESOURCES (MENTAL HEALTH CENTERS):      [facility]  Entry and Referral Services [phone]     [facility], [place], [place] [phone]   [facility], [place], [place] [phone]     [place], [place], [place] [phone]     [facility], [place], [place] [phone]     [facility], [place], [place]  [phone]       [facility], [place], [place] [phone]     [facility], [place], [place]  [phone]       [facility] [address]  [place]) [phone]              [facility] - [phone]

## 2019-02-07 ENCOUNTER — Telehealth (HOSPITAL_BASED_OUTPATIENT_CLINIC_OR_DEPARTMENT_OTHER): Payer: No Typology Code available for payment source

## 2019-03-06 ENCOUNTER — Telehealth (HOSPITAL_BASED_OUTPATIENT_CLINIC_OR_DEPARTMENT_OTHER): Payer: No Typology Code available for payment source

## 2019-03-06 DIAGNOSIS — F411 Generalized anxiety disorder: Secondary | ICD-10-CM

## 2019-03-06 DIAGNOSIS — F331 Major depressive disorder, recurrent, moderate: Secondary | ICD-10-CM

## 2019-03-06 DIAGNOSIS — F410 Panic disorder [episodic paroxysmal anxiety] without agoraphobia: Secondary | ICD-10-CM

## 2019-03-06 NOTE — Progress Notes (Signed)
Called patient's preferred number [phone]); however, no answer or ability to leave VM.    Consuella Lose, Ph.D.

## 2019-03-06 NOTE — Progress Notes (Signed)
[facility]) Follow-Up     Date/Time:  03/06/2019  11:27 AM  Patient Name: Kathy Brewer, Kathy Brewer  MRN:  [id number]  Age: 23 y.o.  DOB: 1995/10/12    PART-1  FOLLOW-UP PHONE TRIAGE        Presenting Concerns     Why are you looking for IPAC services? Looking for a follow-up because experiencing side-effects from medications (have a lump feeling at base of throat that won't go away even after halfing the dosage of Zoloft).    Has anything changed since your last visit? No    Are you connected to any current mental health services since your last visit? No      CSSRS Questions:         C-SSRS Suicidal Ideation Severity      Ask questions that are in the italics for the past month... yes no   1)    Wish to be dead  []    [x]        In the past month, Have you wished you were dead or wished you could go to sleep and not wake up?     2)   Current suicidal thoughts  []    [x]        In the past month, Have you actually had any thoughts of killing yourself?     IF YES TO 2, ASK 3, 4,5. IF NO TO 2, GO TO QUESTION 6.         3)   Suicidal thoughts w/ Method (w/no specific Plan or Intent or act)  []   []        In the past month, Have you been thinking about how you might do this?    (I.e. might overdose; etc.)      4)    Suicidal Intent without Specific Plan  []    []        In the past month, Have you had these thoughts and had some intention of acting on them?    (I.e. having thoughts to overdose with some intent to act on these thoughts)     5)    Intent with Plan  []    []        In the past month, Have you started to work out or worked out the details of how to kill yourself? Do you intend to carry out this plan?    (I.e. plan to overdose on Tuesday with intent to do so on Tuesday vs. General thought for Tuesday)      6) C-SSRS Suicidal Behavior: "Have you ever done anything, started to do anything, or prepared to do anything to end your life? Lifetime Lifetime      []    [x]       3-2 monthago  1 month ago      If YES Was it within the past 3 months?  []   []    [x]       [ IF ORANGE OR RED, COMPLETE IPAC STEP 3 IN THIS SPACE. ]     Safety Questions:      Do you have any current suicidal ideation? No    Do you have any Current or recent Homicidal ideation/intent or plan:No    Do you have any auditory or visual hallucinations? No    Are you feeling unsafe today? No     COVID-19 Screening Questions:       Do you have any symptoms of fever, cough, or shortness of breath? No  IF NO to above: Are you currently experiencing chills, sore throat, new headache, loss of taste or smell, or body aches not attributable to physical activity? No    Have you had close contact with a COVID-19 patient? No      Have you recently been tested for COVID-19? No      Have you ever been diagnosed with COVID-19? No    Do you live in a group living residence, such as an assisted living facility, nursing home, shelter, or dormitory? No    Do you work in a healthcare facility? No        Televideo Screening Questions:     "At this time, we are conducting all psychiatric and clinical visits via televideo."     Do you have access to an electronic device that has access to video if needed? Yes    Do you have access to a private location for a televideo visit? Yes    Do you have access to headphones? Yes    Do you have MyChart set up? Yes MyChart will be used to provide information regarding the televisit, including registration forms.     During your visit, do you have access to your pharmacy information and/or medicine bottles? Yes    Can you provide your pharmacy phone number during the visit? Yes       Substance Use:      Has there been any recent substance use? No  Has there been any recent alcohol use: No  Are you currently experiencing any withdrawal symptoms? No      Accommodations:      Do you require any hard of hearing services? No  Do you require any language services? No      Disposition:      IPAC Appointment:  Lenna Gilford (Video)   Date:  03/07/19   Time: 8:15am      Consuella Lose, Ph.D.

## 2019-03-07 ENCOUNTER — Encounter (HOSPITAL_BASED_OUTPATIENT_CLINIC_OR_DEPARTMENT_OTHER): Payer: Self-pay | Admitting: Psychiatric - Mental Health Nurse Practitioner (Across the Lifespan)

## 2019-03-07 ENCOUNTER — Telehealth (INDEPENDENT_AMBULATORY_CARE_PROVIDER_SITE_OTHER)
Payer: No Typology Code available for payment source | Admitting: Psychiatric - Mental Health Nurse Practitioner (Across the Lifespan)

## 2019-03-07 DIAGNOSIS — T887XXA Unspecified adverse effect of drug or medicament, initial encounter: Secondary | ICD-10-CM

## 2019-03-07 DIAGNOSIS — F411 Generalized anxiety disorder: Secondary | ICD-10-CM

## 2019-03-07 MED ORDER — CLONAZEPAM 0.5 MG PO TABS
0.5000 mg | ORAL_TABLET | Freq: Two times a day (BID) | ORAL | 0 refills | Status: AC | PRN
Start: 2019-03-07 — End: 2019-03-21

## 2019-03-07 MED ORDER — ESCITALOPRAM OXALATE 10 MG PO TABS
ORAL_TABLET | ORAL | 2 refills | Status: DC
Start: 2019-03-07 — End: 2023-08-27

## 2019-03-07 NOTE — Patient Instructions (Signed)
Escitalopram tablets  Brand Name: Lexapro  What is this medicine?  ESCITALOPRAM (es sye TAL oh pram) is used to treat depression and certain types of anxiety.  How should I use this medicine?  Take this medicine by mouth with a glass of water. Follow the directions on the prescription label. You can take it with or without food. If it upsets your stomach, take it with food. Take your medicine at regular intervals. Do not take it more often than directed. Do not stop taking this medicine suddenly except upon the advice of your doctor. Stopping this medicine too quickly may cause serious side effects or your condition may worsen.  A special MedGuide will be given to you by the pharmacist with each prescription and refill. Be sure to read this information carefully each time.  Talk to your pediatrician regarding the use of this medicine in children. Special care may be needed.  What side effects may I notice from receiving this medicine?  Side effects that you should report to your doctor or health care professional as soon as possible:   allergic reactions like skin rash, itching or hives, swelling of the face, lips, or tongue   anxious   black, tarry stools   changes in vision   confusion   elevated mood, decreased need for sleep, racing thoughts, impulsive behavior   eye pain   fast, irregular heartbeat   feeling faint or lightheaded, falls   feeling agitated, angry, or irritable   hallucination, loss of contact with reality   loss of balance or coordination   loss of memory   painful or prolonged erections   restlessness, pacing, inability to keep still   seizures   stiff muscles   suicidal thoughts or other mood changes   trouble sleeping   unusual bleeding or bruising   unusually weak or tired   vomiting  Side effects that usually do not require medical attention (report to your doctor or health care professional if they continue or are bothersome):   changes in appetite   change in sex  drive or performance   headache   increased sweating   indigestion, nausea   tremors  What may interact with this medicine?  Do not take this medicine with any of the following medications:   certain medicines for fungal infections like fluconazole, itraconazole, ketoconazole, posaconazole, voriconazole   cisapride   citalopram   dofetilide   dronedarone   linezolid   MAOIs like Carbex, Eldepryl, Marplan, Nardil, and Parnate   methylene blue (injected into a vein)   pimozide   thioridazine   ziprasidone  This medicine may also interact with the following medications:   alcohol   amphetamines   aspirin and aspirin-like medicines   carbamazepine   certain medicines for depression, anxiety, or psychotic disturbances   certain medicines for migraine headache like almotriptan, eletriptan, frovatriptan, naratriptan, rizatriptan, sumatriptan, zolmitriptan   certain medicines for sleep   certain medicines that treat or prevent blood clots like warfarin, enoxaparin, dalteparin   cimetidine   diuretics   fentanyl   furazolidone   isoniazid   lithium   metoprolol   NSAIDs, medicines for pain and inflammation, like ibuprofen or naproxen   other medicines that prolong the QT interval (cause an abnormal heart rhythm)   procarbazine   rasagiline   supplements like St. John's wort, kava kava, valerian   tramadol   tryptophan  What if I miss a dose?  If you miss a dose,   take it as soon as you can. If it is almost time for your next dose, take only that dose. Do not take double or extra doses.  Where should I keep my medicine?  Keep out of reach of children.  Store at room temperature between 15 and 30 degrees C (59 and 86 degrees F). Throw away any unused medicine after the expiration date.  What should I tell my health care provider before I take this medicine?  They need to know if you have any of these conditions:   bipolar disorder or a family history of bipolar  disorder   diabetes   glaucoma   heart disease   kidney or liver disease   receiving electroconvulsive therapy   seizures (convulsions)   suicidal thoughts, plans, or attempt by you or a family member   an unusual or allergic reaction to escitalopram, the related drug citalopram, other medicines, foods, dyes, or preservatives   pregnant or trying to become pregnant   breast-feeding  What should I watch for while using this medicine?  Tell your doctor if your symptoms do not get better or if they get worse. Visit your doctor or health care professional for regular checks on your progress. Because it may take several weeks to see the full effects of this medicine, it is important to continue your treatment as prescribed by your doctor.  Patients and their families should watch out for new or worsening thoughts of suicide or depression. Also watch out for sudden changes in feelings such as feeling anxious, agitated, panicky, irritable, hostile, aggressive, impulsive, severely restless, overly excited and hyperactive, or not being able to sleep. If this happens, especially at the beginning of treatment or after a change in dose, call your health care professional.  You may get drowsy or dizzy. Do not drive, use machinery, or do anything that needs mental alertness until you know how this medicine affects you. Do not stand or sit up quickly, especially if you are an older patient. This reduces the risk of dizzy or fainting spells. Alcohol may interfere with the effect of this medicine. Avoid alcoholic drinks.  Your mouth may get dry. Chewing sugarless gum or sucking hard candy, and drinking plenty of water may help. Contact your doctor if the problem does not go away or is severe.  NOTE:This sheet is a summary. It may not cover all possible information. If you have questions about this medicine, talk to your doctor, pharmacist, or health care provider. Copyright 2020 [employer]

## 2019-03-07 NOTE — Progress Notes (Signed)
[facility] Psychiatric Follow-Up    Date/Time:   03/07/2019 / 2:15 PM  Name:  Kathy Brewer, Kathy Brewer  MRN:    [id number]  Age:   23 y.o.  DOB:   Dec 31, 1995  Sex:  female     Originating site:  Pt's Home   Distant site:  Provider's Home Office    Verbal consent has been obtained from the patient to conduct a video/phone conference (Vydio) visit encounter to minimize exposure to COVID-19: Yes      CHIEF COMPLAINT  "I'm having side effects from Sertraline"      HISTORY OF PRESENT ILLNESS  23 y/o [place] American female pt with a PPHx of MDD, GAD, Panic attack and Insomnia presenting today with complaint of side effects to Sertraline.  Pt says, one week ago, she developed a "lump" in her throat.  Pt has GERD and says she stopped all treatment due to side effects.  She is afraid to take medication being recommended by GI specialist for fear of developing side effects. Pt says she decreased her dose of Sertraline to a 1/2 tablet and stopped taking Clonazepam, hoping GI symptoms would improve.  Pt says she consulted with her GI specialist who recommended she ask about changing antidepressant medication as it might be contributing to her GI symptoms.      Pt reports her mood has improved since starting Sertraline.  Other than GI symptoms she denies any other adverse reaction.  Pt says she has some residual anxiety, but denies full on panic/anxiety attacks.  Continues to have some difficulty with appetite and wt loss.  Pt says she hesitates with eating due to choking sensation (r/t GERD).  She fears lying flat when sleeping due to issues with reflux; interrupting quality of sleep.  Pt reports feeling good about recently graduating from [employer].  Pt denies SI/Self-Harm.        Current/Home Medications    HYDROXYZINE (ATARAX) 25 MG TABLET    Take 1 tablet (25 mg total) by mouth every 6 (six) hours as needed for Itching or Anxiety May reduce to 12.5 mg in day time.    PANTOPRAZOLE (PROTONIX) 40 MG  TABLET    Take 40 mg by mouth daily           REVIEW OF SYSTEMS   Constitutional: Discomfort r/t GI issues.  Decrease po intake due to GI issues casing wt loss  Gastrointestinal: sensation of lump in her throat r/t GERD; pt currently refusing treatment for GERD; tried a number of agents, but stopped them all due to side effects  All Other Systems Reviewed and Are Negative      PSYCHIATRIC SPECIALITY & MENTAL STATUS EXAM  Vital Signs There were no vitals taken for this visit.   General Appearance Neatly groomed, appropriately dressed and adequately nourished   Muskuloskeletal No weakness, abnormal movements, or other impairments   Speech Normal Rate, Rythym, & Volume   Thought Process Logical, Linear, Goal Directed   Associations Intact    Thought Content No evidence of homicidal, suicidal, violent, or delusional thought content   Perceptions No evidence of hallucinosis   Judgment No Impairment   Insight  Good   LOC/Orientation A&O x 4, Sensorium Clear   Memory Intact   Attention & Concentration Normal   Mood Euthymic   Affect Full-Range / Expressive       ASSESSMENT  23 y.o. African American female pt with a PPHx of MDD, GAD, Panic attack and Insomnia presenting  today with complaint of side effects to Sertraline.. Pt reports feeling like she has a lump in her throat.  She consulted her GI specialist who suggested she have IPAC provider reconsider current treatment as it could worsen her GI symptoms.  Pt is currently refusing all treatment for her GERD due to past experience with PPI's and other OTC anti-acid medications.  Pt reports side effects with every medication she has tried.      Pt educated about the risk of SSRI's contributing to her GI symptoms.  Also discussed the importance of trying the recommended therapy for GERD as persistent assault on her GI system can possibly cause long term damage and place her at risk for other medical issues.  Pt offered the option of trying Lexapro as it may have less GI  impact compared to other SSRI's to target her anxiety and depression.  Will continue prn Clonazepam for two more weeks.  Pt verbalized her understanding and said she would contact her GI specialist to discuss other option for treating her GERD.       Encounter Diagnoses   Name Primary?    GAD (generalized anxiety disorder) Yes    Medication side effects        PLAN  Treatment options and alternatives reviewed with patient, along with detailed discussion of medication(s) and side effects, and they concur with following plan:    Medications:  In lieu of written consent, pt gave verbal consent to order psychopharmacology agents as listed below:     Decrease Sertraline to 12.5 mg for 3 days, then stop.  (Pt has already decreased to 25 mg for past 2-3 days; she resists cross-taper due to fear of side effects)   Then start Escitalopram 10 mg tabs.  For the first 7 days take 1/2 tablet, po, daily.  If tolerated increase to 1 tab, po, daily for depressed mood and anxiety.   Continue Clonazepam 0.5 mg po, bid, prn, anxiety attack.  #28.  No RF's.      Therapies:   Psychotherapy: [facility]ator sent pt a list of providers on 02/05/2019.   Partial Hospital Program: PHP not clinically relevant at this time    Labs/Other:   N/A    I spent a total of 25 minutes caring for the patient today, with more than 50% spent counseling the patient about their condition, goals and treatment plan.     DISPOSITION & FOLLOW-UP   Discharge to: Self-care   Follow-up: Care Navigator emailed pt a list of in-network providers on 02/05/2019.  Discussed purpose of IPAC and reminded pt to contact providers from list to schedule an outpatient appointment.  She verbalized her understanding.     Return to [facility] PRN    _____________________________________________  Thurmond Butts, NP

## 2020-02-25 ENCOUNTER — Encounter (HOSPITAL_BASED_OUTPATIENT_CLINIC_OR_DEPARTMENT_OTHER): Payer: Self-pay

## 2020-03-29 ENCOUNTER — Emergency Department
Admission: EM | Admit: 2020-03-29 | Discharge: 2020-03-29 | Disposition: A | Discharge status: Home / Self Care | Payer: Commercial Managed Care - PPO | Attending: Emergency Medicine | Admitting: Emergency Medicine

## 2020-03-29 DIAGNOSIS — F13230 Sedative, hypnotic or anxiolytic dependence with withdrawal, uncomplicated: Secondary | ICD-10-CM

## 2020-03-29 DIAGNOSIS — K219 Gastro-esophageal reflux disease without esophagitis: Secondary | ICD-10-CM | POA: Insufficient documentation

## 2020-03-29 DIAGNOSIS — F13239 Sedative, hypnotic or anxiolytic dependence with withdrawal, unspecified: Secondary | ICD-10-CM | POA: Insufficient documentation

## 2020-03-29 DIAGNOSIS — R519 Headache, unspecified: Secondary | ICD-10-CM | POA: Insufficient documentation

## 2020-03-29 DIAGNOSIS — F13930 Sedative, hypnotic or anxiolytic use, unspecified with withdrawal, uncomplicated: Secondary | ICD-10-CM

## 2020-03-29 MED ORDER — ACETAMINOPHEN 500 MG PO TABS
1000.0000 mg | ORAL_TABLET | Freq: Once | ORAL | Status: DC
Start: 2020-03-29 — End: 2020-03-29

## 2020-03-29 MED ORDER — TAB-A-VITE/BETA CAROTENE PO TABS
1.0000 | ORAL_TABLET | Freq: Once | ORAL | Status: DC
Start: 2020-03-29 — End: 2020-03-29

## 2020-03-29 MED ORDER — LORAZEPAM 1 MG PO TABS
1.0000 mg | ORAL_TABLET | ORAL | Status: DC | PRN
Start: 2020-03-29 — End: 2020-03-29

## 2020-03-29 MED ORDER — THIAMINE (VITAMIN B1) 100 MG PO TABS (WRAP)
100.0000 mg | ORAL_TABLET | Freq: Once | ORAL | Status: DC
Start: 2020-03-29 — End: 2020-03-29

## 2020-03-29 MED ORDER — KETOROLAC TROMETHAMINE 30 MG/ML IJ SOLN
30.00 mg | Freq: Once | INTRAMUSCULAR | Status: DC
Start: 2020-03-29 — End: 2020-03-29

## 2020-03-29 MED ORDER — FOLIC ACID 1 MG PO TABS
1.0000 mg | ORAL_TABLET | Freq: Once | ORAL | Status: DC
Start: 2020-03-29 — End: 2020-03-29

## 2020-03-29 NOTE — ED Triage Notes (Addendum)
Patient aaox4, to ED for eval of headache, nausea, and trouble sleeping x couple of days that was at its worst last night. Patient states she has been prescribed benzo's x1 year for panic attacks and has been trying to get off of them bc "she doesn't feel she needs them that much." Endorses she was prescribed valium by PMD, didn't like how it made her feel so continued taking clonazepam. At this time denies SI/HI. Hx of depression, GERD and gastritis.

## 2020-03-29 NOTE — ED Notes (Signed)
Dr Arville Care to this RN and care team to discuss plan of care for patient and possible admission to [facility]. This Rn to bedside to change patient out of personal belongings and into green gown as per hospital policy. Patient asks if she is allowed to have personal cell phone on her and states "The doctor told me Im allowed to keep my phone." Called [facility] and discussed policy of cell phone. Patients phone remains locked in locker and able to use phone at nurse station. Relayed this to patient who then stated "I am not staying then. If I dont have my phone then I dont feel safe." Will make MD aware.

## 2020-03-29 NOTE — ED Notes (Signed)
This RN at bedside with Dr Arville Care for patient consult.  Pt refusing to be admitted to [facility] since she is unable to keep her phone with her.  Several options given to pt to help alleviate sx of withdrawal.  Pt states that she just wants to go home and will follow up with her current health care providers.

## 2020-03-29 NOTE — ED Provider Notes (Signed)
[facility] EMERGENCY DEPARTMENT H&P      Visit date: 03/29/2020      CLINICAL SUMMARY           Diagnosis:    .     Final diagnoses:   Acute nonintractable headache, unspecified headache type   Benzodiazepine withdrawal without complication         MDM Notes:      HA - has had CT and MRI and neuro - no need to rpt imaging  HA/migraine cocktail offered and declined.  Discussed withdrawal at length - will talk to [facility] about if she meets criteria for IP stay for benzo withdrawal    She wants to come off meds but does not want any SE or symptoms while doing so. Has already tried gabapentin to assist with rapid taper but did not tolerate that either.    Accepted to [facility] pending labs per protocol but when informed she could not have laptop and cell phone she changed her mind.  Explained her expectations are unreasonable and her current options are to continue OP tapering with her current team OR continue on the benzos without tapering off OR stay with [facility] for rapid taper in supervised setting. If she picks [facility] she has to abide by their policies. All the options available do not guarantee lack of withdrawal symptoms.    I have asked the [facility] liaison NP to come and discuss further to see if she can answer more questions and help further.     In the end Kathy Brewer does not wish to stay. She requests that I prescribe further benzos for her - I explained that with her hx and she has tried most benzos already, I am not comfortable prescribing her further benzos. I did offer a prescription for Keppra as an anti-seizure medicine to help facilitate rapid tapering as an OP as she has not tolerated gabapentin in the past. Otherwise, she is already established with a team who can help with her benzo taper and it is inappropriate at this time for me to interfere with their plan, especially as they are addiction specialists.    PMP was reviewed - no prescriptions noted. EMR shows prescriptions for  alprazolam, chlordiazepoxide, buspirone, clonazepam, diazepam, duloxetine, cyclobenazaprine, fluoxetine, gabapentin, hydroxyzine, lorazepam, methocarbamol, mirtazepine, paroxetine, sertraline, trazodone, escitalopram. Some of which have been filled recently and all have been prescribed/filled in the past 12-24 months.  This confirms her details of prior attempts at various therapies with side effects but also add to my polypharmacy concerns and add to my decision that it is best if she has one team with her complete medical history and office notes making changes to her current medications with ability to monitor her for problems/side effects and outcomes.         Disposition:         Discharge           I was present for the bedside discharge alongside the patient's ED nurse. Course of visit in the ED and discharge instructions were reviewed with patient and they were given the opportunity to ask any questions regarding their care today. Patient and/ or patient's family verbalized understanding of, and comfort with, instructions and plan.          Discharge Prescriptions     None                         CLINICAL INFORMATION  HPI:      Chief Complaint: Headache  .    Kathy Brewer is a 24 y.o. female who presents with ongoing moderately severe frontal HA that radiates to the back of her neck since she has been trying to taper/get off benzos. She was prescribed benzos last year for panic attacks which were later found to be caused by GERD which is now under control with dietary changes.  She has been trying with OP support to taper slowly off meds but is getting a lot of SE - mainly the HA. Has had CT and MRI and seen neurology.  She has tried Xanax, Valium, Ativan, Librium all as she tries to taper down    History obtained from: Patient    Mother at bedside throughout.      ROS:      Positive and negative ROS elements as per HPI.  The balance of a ten point review of systems is otherwise negative.        Physical Exam:      Pulse 93   BP 108/69   Resp 14   SpO2 99 %   Temp 98.8 F (37.1 C)    Physical Exam  Constitutional:       Appearance: She is well-developed.   HENT:      Head: Normocephalic.   Eyes:      General: No scleral icterus.     Conjunctiva/sclera: Conjunctivae normal.   Cardiovascular:      Rate and Rhythm: Normal rate and regular rhythm.      Heart sounds: Normal heart sounds. No murmur heard.     Pulmonary:      Effort: Pulmonary effort is normal. No respiratory distress.      Breath sounds: Normal breath sounds. No stridor.   Abdominal:      General: Bowel sounds are normal. There is no distension.      Palpations: Abdomen is soft.      Tenderness: There is no abdominal tenderness.   Musculoskeletal:      Cervical back: Normal range of motion and neck supple.   Skin:     General: Skin is warm and dry.      Capillary Refill: Capillary refill takes less than 2 seconds.      Findings: No erythema.   Neurological:      General: No focal deficit present.      Mental Status: She is alert and oriented to person, place, and time.      Cranial Nerves: No cranial nerve deficit.   Psychiatric:         Attention and Perception: Attention normal.         Mood and Affect: Mood is depressed. Affect is tearful.         Behavior: Behavior is cooperative.         Thought Content: Thought content does not include homicidal or suicidal ideation.         Cognition and Memory: Cognition and memory normal.                    PAST HISTORY        Primary Care Provider: Patsy Lager, MD        PMH/PSH:    .     Past Medical History:   Diagnosis Date    Anxiety     Arthritis     Asthma     Asthma     Depression  She has no past surgical history on file.      Social/Family History:      She reports that she has never smoked. She has never used smokeless tobacco. She reports that she does not drink alcohol and does not use drugs.    Family History   Problem Relation Age of Onset    Anxiety disorder Brother      Schizophrenia Maternal Grandmother          Listed Medications on Arrival:    .     Home Medications             clonazePAM (KlonoPIN) 0.5 MG tablet     Take 0.5 mg by mouth 2 (two) times daily as needed     escitalopram (LEXAPRO) 10 MG tablet     For the 1st 7 days take 1/2 tab by mouth every morning.  If tolerated increase to 1 tab daily.     pantoprazole (PROTONIX) 40 MG tablet     Take 40 mg by mouth daily         Allergies: She is allergic to singulair [montelukast] and penicillins.            VISIT INFORMATION        Clinical Course in the ED:                 Medications Given in the ED:    .     ED Medication Orders (From admission, onward)    Start Ordered     Status Ordering Provider    03/29/20 1437 03/29/20 1436    Once     Route: Intramuscular  Ordered Dose: 30 mg     Discontinued Earlean Shawl    03/29/20 1437 03/29/20 1436    Once     Route: Oral  Ordered Dose: 1,000 mg     Discontinued Earlean Shawl    03/29/20 1237 03/29/20 1236    Once     Route: Oral  Ordered Dose: 1 tablet     Discontinued Earlean Shawl    03/29/20 1237 03/29/20 1236    Once     Route: Oral  Ordered Dose: 100 mg     Discontinued Earlean Shawl    03/29/20 1237 03/29/20 1236    Once     Route: Oral  Ordered Dose: 1 mg     Discontinued Earlean Shawl    03/29/20 1235 03/29/20 1236    Every 1 hour PRN     Route: Oral  Ordered Dose: 1-2 mg     Discontinued Angelisse Riso J            Procedures:      Procedures      Interpretations:      O2 sat-           saturation: 99 %; Oxygen use: room air; Interpretation: Normal                   RESULTS        Lab Results:      Results     Procedure Component Value Units Date/Time    Urine [facility] POC [[id number]] Collected: 03/29/20 1237     Updated: 03/29/20 1237              Radiology Results:      No orders to display               Scribe  Attestation:      No scribe involved in the care of this patient          Earlean Shawl, MD  04/01/20 1037

## 2020-03-29 NOTE — ED Notes (Signed)
Pt changed into green pants and gown.  Security called for box and wand.

## 2020-03-29 NOTE — Discharge Instructions (Signed)
Dear Ms. Montez Morita:    Thank you for choosing the [facility] Emergency Department, the premier emergency department in the [place] area.  I hope your visit today was EXCELLENT.    Specific instructions for your visit today:    I am sorry we were not able to come up with a better plan today - please follow-up with your outpatient team for ongoing help with tapering your benzodiazepines.  We are here 24hours a day if you change your mind.    Headache    You have been treated for a headache.    Headaches are very common. Most of the time they are benign (not harmful). Some headaches can be very serious. Your headache appears to be benign. The doctor feels it is OK for you to go home.    If you continue to have headaches, or if this headache does not go away over the next few days, you should be evaluated by your regular doctor or a neurologist. Keeping a headache diary may help your doctor learn the cause of your headaches.    When you get a headache, write down:   What happens before your headache starts - Where you were, what you were doing, if you ate anything, and so on.   Where your pain is.   What kind-of pain you have - Sharp, aching, throbbing, burning.    What helps your headache get better.    Take your headache medication as directed. This is very important if your doctor has placed you on a daily medication to prevent headaches.    Return here or go to the nearest Emergency Department immediately if:   Your headache gets worse.   You have a severe headache that starts suddenly.   Your head pain is different from your normal headache.   You have a fever (temperature higher than 100.61F / 38C), especially with a stiff neck.   You feel numbness, tingling, or weakness in your arms or legs.   You pass out.   You have problems with your vision.   You vomit (throw up) and have trouble taking medication or keeping it down.    If you can't follow up with your doctor, or if at any time you  feel you need to be rechecked or seen again, come back here or go to the nearest emergency department.                 Benzodiazepine Withdrawal    You have been seen for symptoms of withdrawal from a medicine. The medicine belongs to a family of drugs called benzodiazepines.    Some benzodiazepines are:   Diazepam (Valium).   Lorazepam (Ativan).   Chlordiazepoxide (Librium).   Alprazolam (Xanax).   Estazolam (ProSom).   Flurazepam (Dalmane).   Temazepam (Restoril).   Triazolam (Halcion).   Clorazepate (Tranxene).   Halazepam (Paxipam).   Oxazepam (Serax).   Prazepam (Centrax).   Quazepam (Doral).   Clonazepam (Klonopin).    This medicine helps with anxiety or problems sleeping. It also helps with irritability and seizures. It also treats muscle spasms and alcohol withdrawal. People who use benzodiazepines for a long time can get dependent on them. This means that people can have symptoms when they stop taking the medicine. These symptoms can be unpleasant or even dangerous. Withdrawal can also happen if the dose is just lowered. Withdrawal is the word used to describe these symptoms. You can become dependent when you take low doses prescribed  by your doctor. You can also become dependent when you take larger doses for recreation (abuse).    Symptoms may include anxiety, irritability, sweating, increased body temperature. Breathing or the heart rate may be fast. There may be tremors (shaking), sleep problems, strange feelings of movement or very high sensitivity to sounds and light. Other symptoms are depression, suicidal behavior and hallucinations. There could be problems similar to alcohol withdrawal (delirium tremens DTs). Some people have seizures that are hard to control.    Sudden withdrawal can be dangerous. This is why it is better to stop taking the medicines over time. The time needed for full withdrawal can be from 4 weeks to 6 months. Sometimes, it can be several  years! People who have been using benzodiazepines for a long time may not have withdrawal symptoms right away. It can take seven to ten days before symptoms are noticed.    VERY IMPORTANT: Withdrawal from these drugs is very unpleasant. It can also be dangerous. For this reason, make plans to manage withdrawal with your doctor or specialist.   Usually, treatment is to start the medicine again or start to taper the benzodiazepine. This means you will very slowly decrease the amount of benzodiazepines you take. This can be over a period of days to weeks. This is to help lessen the symptoms of withdrawal.   In some cases the doctor might want you to go back on your medicines at your regular dose.    Start taking your medicines like you did before, until you get into a program to stop the medicine. Your doctor or the specialist can help you with this.    YOU SHOULD SEEK MEDICAL ATTENTION IMMEDIATELY, EITHER HERE OR AT THE NEAREST EMERGENCY DEPARTMENT, IF ANY OF THE FOLLOWING OCCUR:   You have any of the above problems.   You have problems breathing.   You get confused, feel angry or your behavior changes or gets out of control.   You have seizures or go unconscious.   You think about hurting yourself or others.   You think about killing yourself or others.   You have other concerns.        If you do not continue to improve or your condition worsens, please contact your doctor or return immediately to the Emergency Department.    Sincerely,  Arville Care, Evern Bio, MD  Attending Emergency Physician  [facility] Emergency Department    ONSITE PHARMACY  Our full service onsite pharmacy is located in the ER waiting room.  Open 7 days a week from 9 am to 9 pm.  We accept all major insurances and prices are competitive with major retailers.  Ask your provider to print your prescriptions down to the pharmacy to speed you on your way home.    OBTAINING A PRIMARY CARE APPOINTMENT    Primary care physicians (PCPs,  also known as primary care doctors) are either internists or family medicine doctors. Both types of PCPs focus on health promotion, disease prevention, patient education and counseling, and treatment of acute and chronic medical conditions.    Call for an appointment with a primary care doctor.  Ask to see who is taking new patients.     [facility]  telephone:  [phone]  [email or web address]    DOCTOR REFERRALS  Call [phone] (available 24 hours a day, 7 days a week) if you need any further referrals and we can help you find a primary care doctor or specialist.  Also, available online at:  [email or web address]    YOUR CONTACT INFORMATION  Before leaving please check with registration to make sure we have an up-to-date contact number.  You can call registration at [phone] to update your information.  For questions about your hospital bill, please call [phone].  For questions about your Emergency Dept Physician bill please call [phone].      FREE HEALTH SERVICES  If you need help with health or social services, please call 2-1-1 for a free referral to resources in your area.  2-1-1 is a free service connecting people with information on health insurance, free clinics, pregnancy, mental health, dental care, food assistance, housing, and substance abuse counseling.  Also, available online at:  http://www.[facility].org    MEDICAL RECORDS AND TESTS  Certain laboratory test results do not come back the same day, for example urine cultures.   We will contact you if other important findings are noted.  Radiology films are often reviewed again to ensure accuracy.  If there is any discrepancy, we will notify you.      Please call [phone] to pick up a complimentary CD of any radiology studies performed.  If you or your doctor would like to request a copy of your medical records, please call [phone].      ORTHOPEDIC INJURY   Please know that significant injuries  can exist even when an initial x-ray is read as normal or negative.  This can occur because some fractures (broken bones) are not initially visible on x-rays.  For this reason, close outpatient follow-up with your primary care doctor or bone specialist (orthopedist) is required.    MEDICATIONS AND FOLLOWUP  Please be aware that some prescription medications can cause drowsiness.  Use caution when driving or operating machinery.    The examination and treatment you have received in our Emergency Department is provided on an emergency basis, and is not intended to be a substitute for your primary care physician.  It is important that your doctor checks you again and that you report any new or remaining problems at that time.      24 HOUR PHARMACIES  The nearest 24 hour pharmacy is:    [facility]  [address]  [place], [place] [address]  [phone]      ASSISTANCE WITH INSURANCE    Affordable Care Act  [facility])  Call to start or finish an application, compare plans, enroll or ask a question.  [phone]  TTY: [phone]  Web:  Healthcare.gov    Help Enrolling in [facility]  [employer] [place]  ([phone] (TOLL-FREE)  [phone] (TTY)  Web:  Http://www.coverva.org    Local Help Enrolling in the [facility]  [place] [facility]  [phone] (MAIN)  Email:  health-help@nvfs .org  Web:  BlackjackMyths.is  Address:  [address], [address] [place], [place] [address]    SEDATING MEDICATIONS  Sedating medications include strong pain medications (e.g. narcotics), muscle relaxers, benzodiazepines (used for anxiety and as muscle relaxers), Benadryl/diphenhydramine and other antihistamines for allergic reactions/itching, and other medications.  If you are unsure if you have received a sedating medication, please ask your physician or nurse.  If you received a sedating medication: DO NOT drive a car. DO NOT operate machinery. DO NOT perform jobs where you need to be alert.  DO NOT drink  alcoholic beverages while taking this medicine.     If you get dizzy, sit or lie down at the first signs. Be careful going  up and down stairs.  Be extra careful to prevent falls.     Never give this medicine to others.     Keep this medicine out of reach of children.     Do not take or save old medicines. Throw them away when outdated.     Keep all medicines in a cool, dry place. DO NOT keep them in your bathroom medicine cabinet or in a cabinet above the stove.    MEDICATION REFILLS  Please be aware that we cannot refill any prescriptions through the ER. If you need further treatment from what is provided at your ER visit, please follow up with your primary care doctor or your pain management specialist.    [place]  Did you know [facility] has two freestanding ERs located just a few miles away?  [facility] [facility] [place] have short wait times, easy free parking directly in front of the building and top patient satisfaction scores - and the same Board Certified Emergency Medicine doctors as [facility].

## 2020-10-24 IMAGING — RF DG ESOPHAGUS
6 series · 13 of 14 positions shown · non-contrast
Comparison: Chest CT and radiographs earlier today.

CLINICAL DATA: 22-year-old female who had been vomiting found to
have pneumomediastinum and gas surrounding the esophagus on Chest CT
today.

EXAM:
ESOPHOGRAM/BARIUM SWALLOW
TECHNIQUE: Single contrast examination was performed using water-soluble
contrast and ultimately thin barium.
FLUOROSCOPY TIME:  Fluoroscopy Time:  0 minutes 48 seconds
Radiation Exposure Index (if provided by the fluoroscopic device):
Number of Acquired Spot Images: 0

[Series 1: cp_standard · 0.51mm/px · 3 of 52 frames shown (1 of 6)]
[frame 8/52]
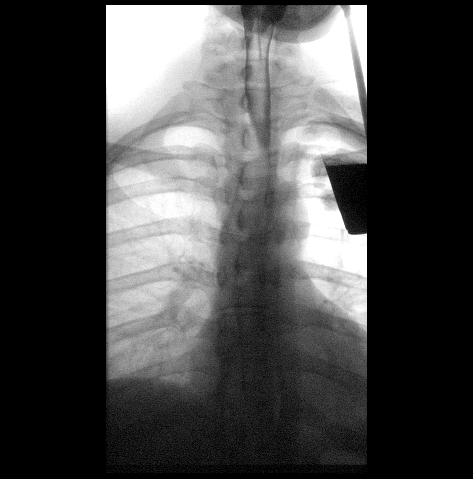
[frame 27/52]
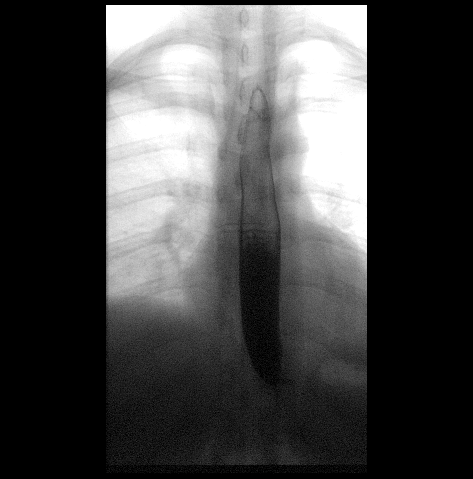
[frame 45/52]
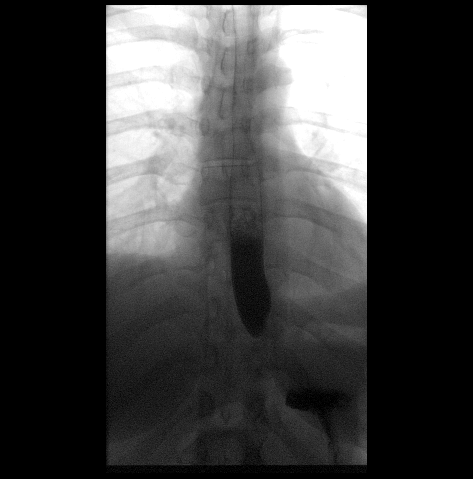

[Series 2: cp_standard · 0.34mm/px · 3 of 69 frames shown (2 of 6)]
[frame 11/69]
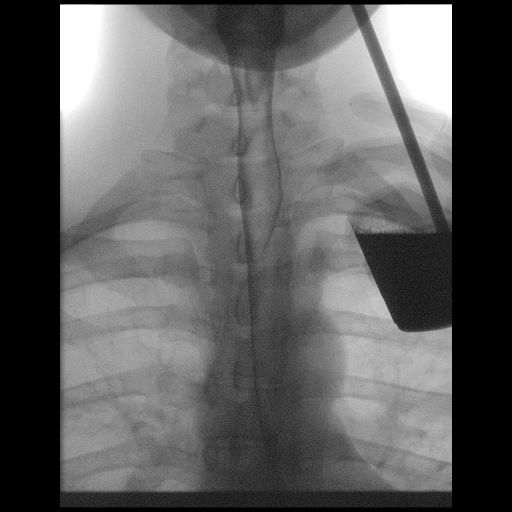
[frame 35/69]
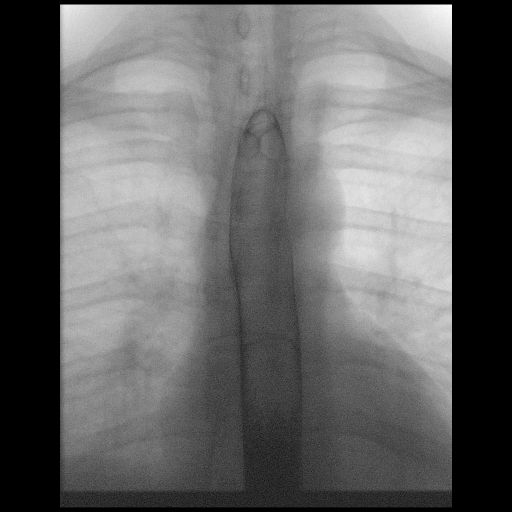
[frame 59/69]
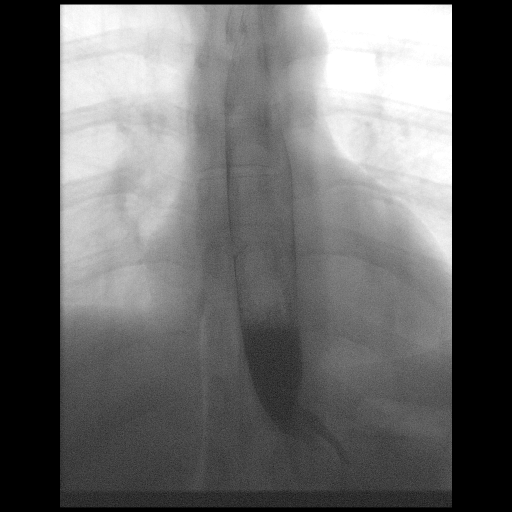

[Series 3: cp_standard · 0.34mm/px · 4 of 69 frames shown (3 of 6)]
[frame 11/69]
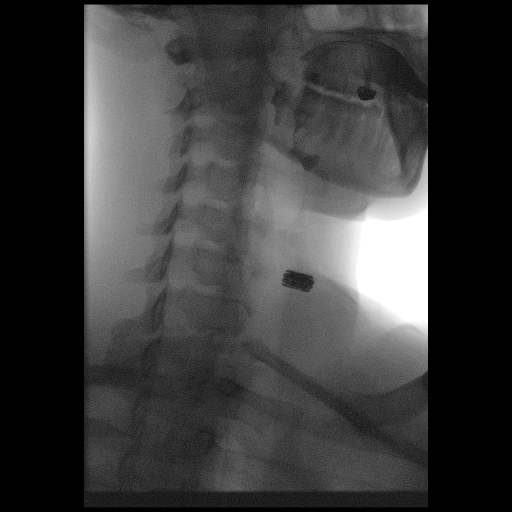
[frame 13/69]
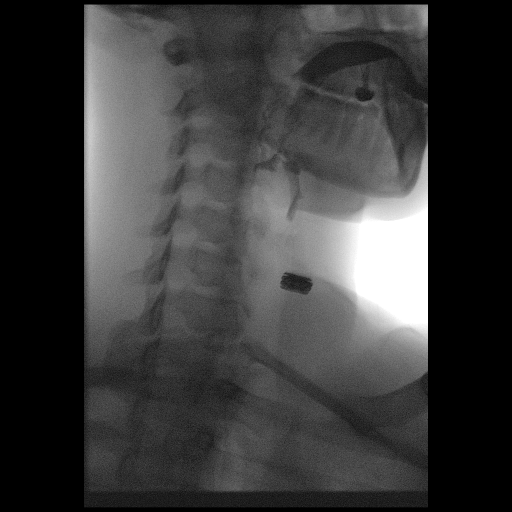
[frame 35/69]
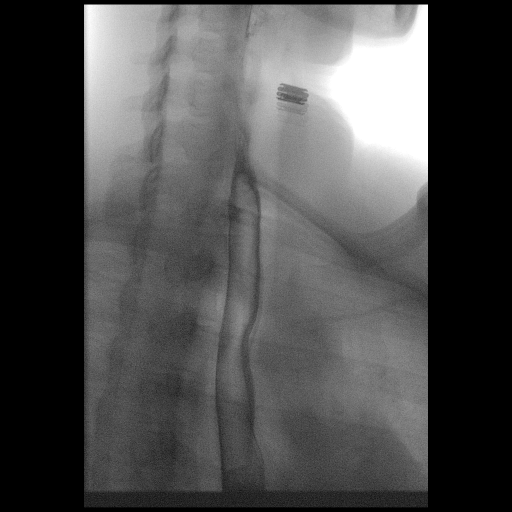
[frame 59/69]
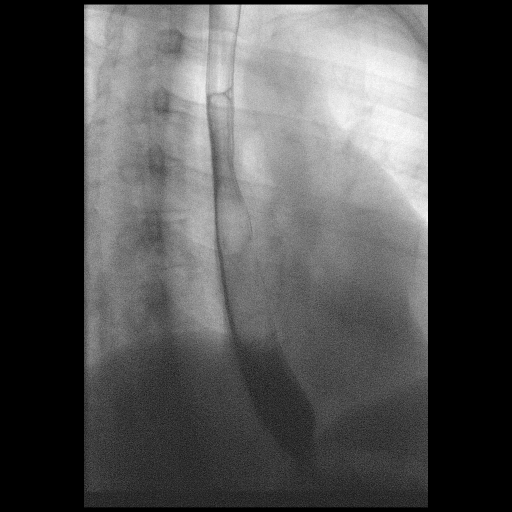

[Series 4: cp_standard · 0.17mm/px · 1 of 1 slices shown (4 of 6)]
[im 1/1]
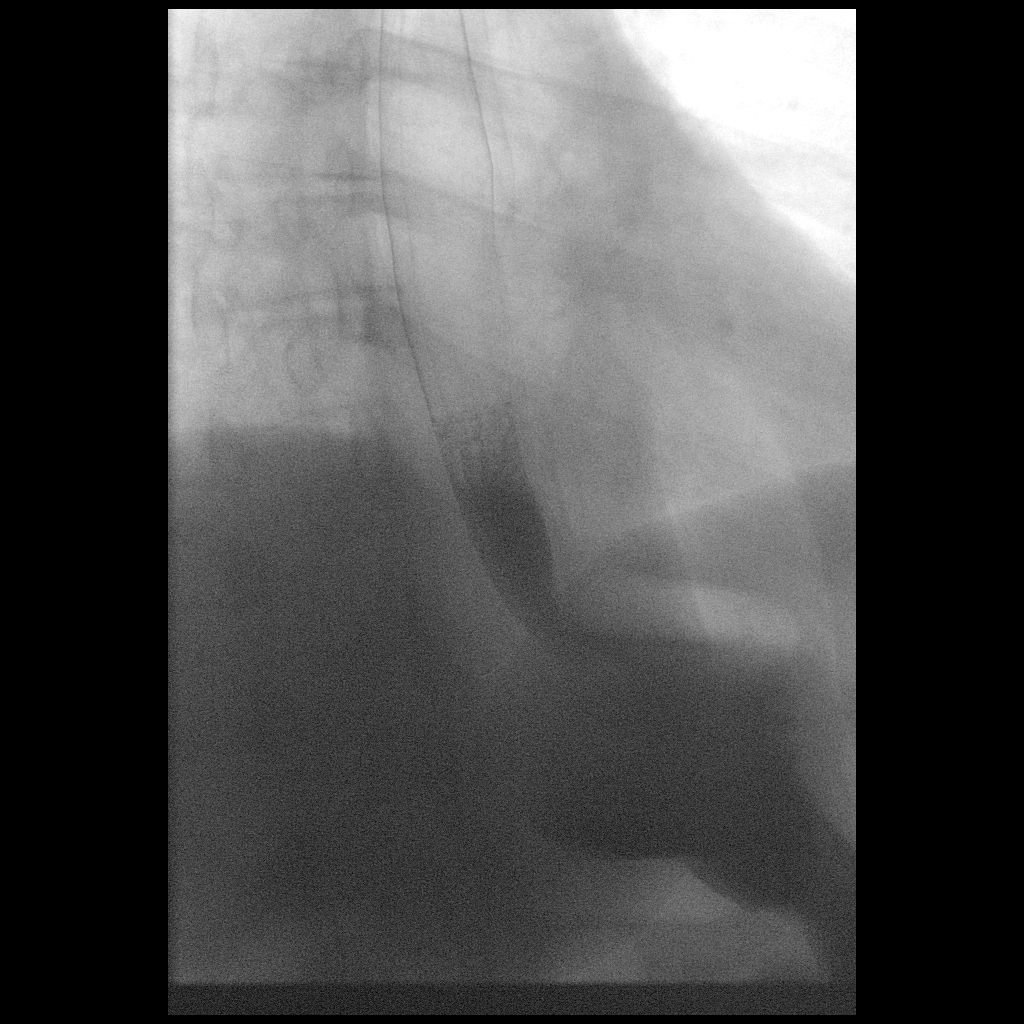

[Series 5: cp_standard · 0.26mm/px · 1 of 1 slices shown (5 of 6)]
[im 1/1]
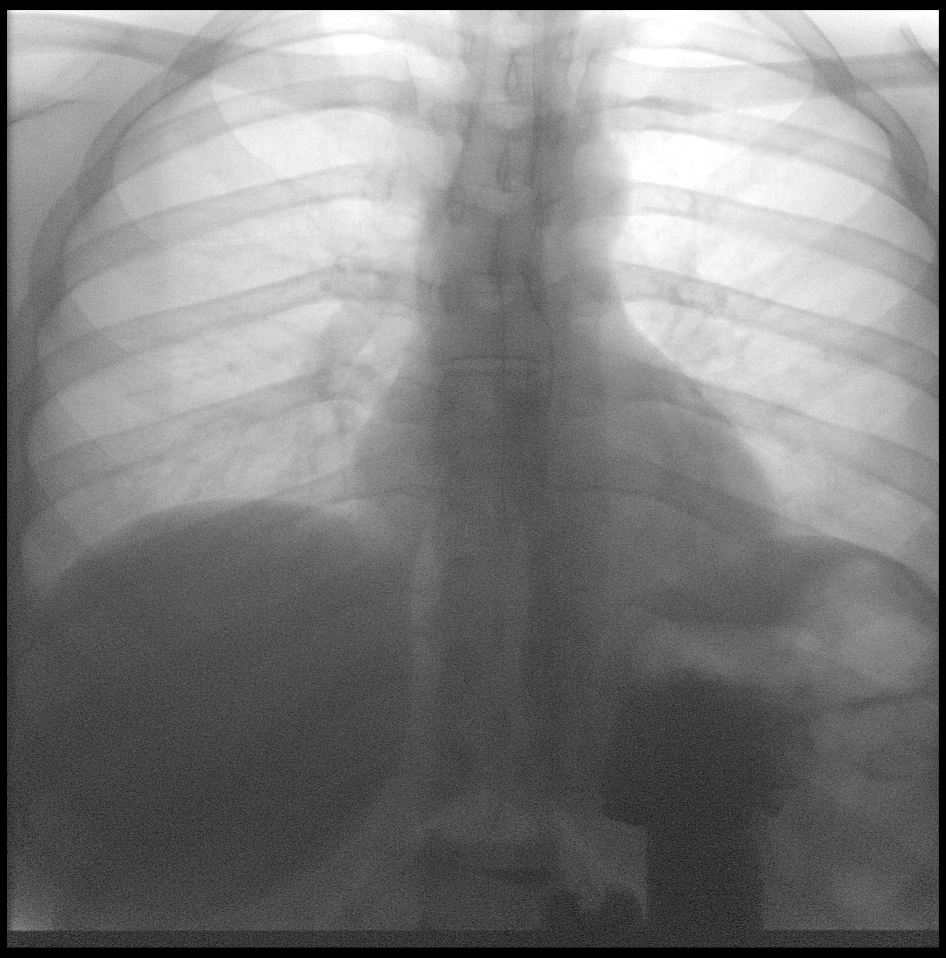

[Series 6: cp_standard · 0.25mm/px · 1 of 1 slices shown (6 of 6)]
[im 1/1]
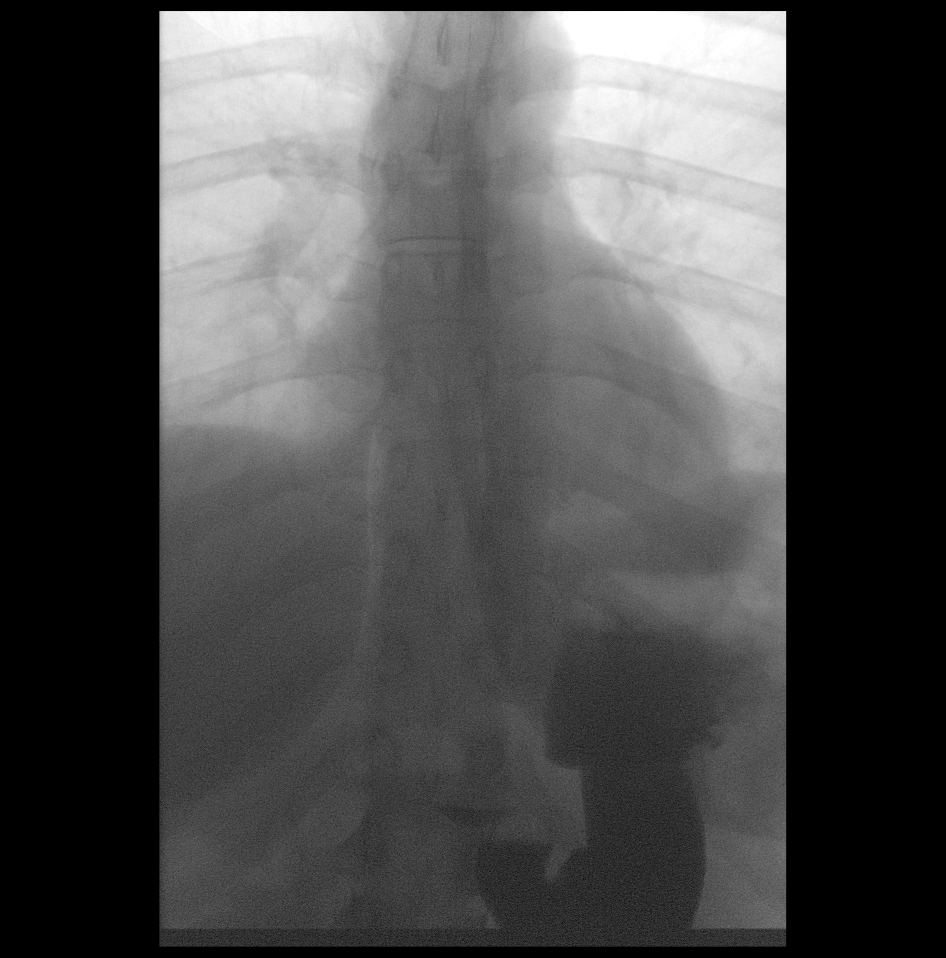

[13 of 14 positions shown; findings below may reference images not displayed]

FINDINGS: A single contrast study was undertaken and the patient tolerated
this well and without difficulty.

No obstruction to the forward flow of contrast throughout the
esophagus and into the stomach. Normal esophageal course and
contour.

Normal gastroesophageal junction.  Negative for esophageal leak.
IMPRESSION: Negative single contrast esophagram. No evidence of esophageal
perforation.

## 2022-08-04 DIAGNOSIS — Q453 Other congenital malformations of pancreas and pancreatic duct: Secondary | ICD-10-CM

## 2022-08-04 HISTORY — DX: Other congenital malformations of pancreas and pancreatic duct: Q45.3

## 2022-08-16 ENCOUNTER — Emergency Department: Payer: BC Managed Care – PPO

## 2022-08-16 ENCOUNTER — Emergency Department
Admission: EM | Admit: 2022-08-16 | Discharge: 2022-08-17 | Disposition: A | Discharge status: Home / Self Care | Payer: BC Managed Care – PPO | Attending: Emergency Medicine | Admitting: Emergency Medicine

## 2022-08-16 DIAGNOSIS — K219 Gastro-esophageal reflux disease without esophagitis: Secondary | ICD-10-CM

## 2022-08-16 DIAGNOSIS — R079 Chest pain, unspecified: Secondary | ICD-10-CM

## 2022-08-16 DIAGNOSIS — K2970 Gastritis, unspecified, without bleeding: Secondary | ICD-10-CM | POA: Insufficient documentation

## 2022-08-16 HISTORY — DX: Irritable bowel syndrome, unspecified: K58.9

## 2022-08-16 HISTORY — DX: Gastro-esophageal reflux disease without esophagitis: K21.9

## 2022-08-16 LAB — CBC AND DIFFERENTIAL
Absolute NRBC: 0 10*3/uL (ref 0.00–0.00)
Basophils Absolute Automated: 0.09 10*3/uL — ABNORMAL HIGH (ref 0.00–0.08)
Basophils Automated: 1 %
Eosinophils Absolute Automated: 0.06 10*3/uL (ref 0.00–0.44)
Eosinophils Automated: 0.7 %
Hematocrit: 36.2 % (ref 34.7–43.7)
Hgb: 12.7 g/dL (ref 11.4–14.8)
Immature Granulocytes Absolute: 0.03 10*3/uL (ref 0.00–0.07)
Immature Granulocytes: 0.3 %
Instrument Absolute Neutrophil Count: 5.4 10*3/uL (ref 1.10–6.33)
Lymphocytes Absolute Automated: 2.77 10*3/uL (ref 0.42–3.22)
Lymphocytes Automated: 31.2 %
MCH: 33.1 pg (ref 25.1–33.5)
MCHC: 35.1 g/dL (ref 31.5–35.8)
MCV: 94.3 fL (ref 78.0–96.0)
MPV: 11.7 fL (ref 8.9–12.5)
Monocytes Absolute Automated: 0.53 10*3/uL (ref 0.21–0.85)
Monocytes: 6 %
Neutrophils Absolute: 5.4 10*3/uL (ref 1.10–6.33)
Neutrophils: 60.8 %
Nucleated RBC: 0 /100 WBC (ref 0.0–0.0)
Platelets: 238 10*3/uL (ref 142–346)
RBC: 3.84 10*6/uL — ABNORMAL LOW (ref 3.90–5.10)
RDW: 12 % (ref 11–15)
WBC: 8.88 10*3/uL (ref 3.10–9.50)

## 2022-08-16 LAB — COMPREHENSIVE METABOLIC PANEL
ALT: 7 U/L (ref 0–55)
AST (SGOT): 14 U/L (ref 5–41)
Albumin/Globulin Ratio: 1.3 (ref 0.9–2.2)
Albumin: 4 g/dL (ref 3.5–5.0)
Alkaline Phosphatase: 72 U/L (ref 37–117)
Anion Gap: 12 (ref 5.0–15.0)
BUN: 10 mg/dL (ref 7.0–21.0)
Bilirubin, Total: 0.7 mg/dL (ref 0.2–1.2)
CO2: 16 mEq/L — ABNORMAL LOW (ref 17–29)
Calcium: 9.3 mg/dL (ref 8.5–10.5)
Chloride: 109 mEq/L (ref 99–111)
Creatinine: 0.8 mg/dL (ref 0.4–1.0)
Globulin: 3.1 g/dL (ref 2.0–3.6)
Glucose: 76 mg/dL (ref 70–100)
Potassium: 3.1 mEq/L — ABNORMAL LOW (ref 3.5–5.3)
Protein, Total: 7.1 g/dL (ref 6.0–8.3)
Sodium: 137 mEq/L (ref 135–145)
eGFR: >60 mL/min/{1.73_m2} (ref >=60)

## 2022-08-16 LAB — ECG 12-LEAD
IHS MUSE NARRATIVE AND IMPRESSION: NORMAL
QTC Calculation (Bezet): 453 ms
R Axis: 62 degrees
T Axis: 35 degrees
Ventricular Rate: 75 {beats}/min

## 2022-08-16 MED ORDER — ALPRAZOLAM 0.25 MG PO TABS
0.2500 mg | ORAL_TABLET | Freq: Once | ORAL | Status: DC
Start: 2022-08-16 — End: 2022-08-17
  Filled 2022-08-16: qty 1

## 2022-08-16 NOTE — ED Notes (Signed)
I have assisted in the ordering of diagnostic studies necessary to expedite care in triage and initial testing was ordered based on presenting complaint. I am not the primary physician for this patient.       Elesa Hacker, MD  08/16/22 2141

## 2022-08-17 LAB — ECG 12-LEAD
Atrial Rate: 75 {beats}/min
P Axis: 65 degrees
P-R Interval: 140 ms
Q-T Interval: 406 ms
QRS Duration: 78 ms

## 2022-08-17 LAB — HIGH SENSITIVITY TROPONIN-I: hs Troponin-I: <2.7 ng/L

## 2022-08-17 MED ORDER — SODIUM CHLORIDE 0.9 % IV BOLUS
250.0000 mL | Freq: Once | INTRAVENOUS | Status: AC
Start: 2022-08-17 — End: 2022-08-17
  Administered 2022-08-17: 250 mL via INTRAVENOUS

## 2022-08-17 NOTE — ED Triage Notes (Signed)
Patient complains of left sided chest pain and spasms in middle of chest. Hx of GERD and IBS. States pain is also in ULQ. Denies nausea/vomiting but is frequently belching. States has felt constipated so took mag citrate and has had diarrhea this evening.

## 2022-08-17 NOTE — Discharge Instructions (Signed)
Dear Ms. Montez Morita:    Thank you for choosing the [facility] Emergency Department, the premier emergency department in the [place] area.  I hope your visit today was EXCELLENT. You will receive a survey via text message that will give you the opportunity to provide feedback to your team about your visit. Please do not hesitate to reach out with any questions!    Specific instructions for your visit today:    IF YOU DO NOT CONTINUE TO IMPROVE OR YOUR CONDITION WORSENS, PLEASE CONTACT YOUR DOCTOR OR RETURN IMMEDIATELY TO THE EMERGENCY DEPARTMENT.    Sincerely,  Randol Kern, MD  Attending Emergency Physician  [facility] Emergency Department      OBTAINING A PRIMARY CARE APPOINTMENT    Primary care physicians (PCPs, also known as primary care doctors) are either internists or family medicine doctors. Both types of PCPs focus on health promotion, disease prevention, patient education and counseling, and treatment of acute and chronic medical conditions.    If you need a primary care doctor, please call the below number and ask who is receiving new patients.     [facility]  Telephone:  [phone]  [email or web address]    DOCTOR REFERRALS  Call [phone] (available 24 hours a day, 7 days a week) if you need any further referrals and we can help you find a primary care doctor or specialist.  Also, available online at:  [email or web address]    YOUR CONTACT INFORMATION  Before leaving please check with registration to make sure we have an up-to-date contact number.  You can call registration at [phone] to update your information.  For questions about your hospital bill, please call [phone].  For questions about your Emergency Dept Physician bill please call [phone].      FREE HEALTH SERVICES  If you need help with health or social services, please call 2-1-1 for a free referral to resources in your area.  2-1-1 is a free service connecting people with  information on health insurance, free clinics, pregnancy, mental health, dental care, food assistance, housing, and substance abuse counseling.  Also, available online at:  http://www.[place].org    ORTHOPEDIC INJURY   Please know that significant injuries can exist even when an initial x-ray is read as normal or negative.  This can occur because some fractures (broken bones) are not initially visible on x-rays.  For this reason, close outpatient follow-up with your primary care doctor or bone specialist (orthopedist) is required.    MEDICATIONS AND FOLLOWUP  Please be aware that some prescription medications can cause drowsiness.  Use caution when driving or operating machinery.    The examination and treatment you have received in our Emergency Department is provided on an emergency basis, and is not intended to be a substitute for your primary care physician.  It is important that your doctor checks you again and that you report any new or remaining problems at that time.      ASSISTANCE WITH INSURANCE    Affordable Care Act  [facility])  Call to start or finish an application, compare plans, enroll or ask a question.  [phone]  TTY: ([phone]  Web:  Healthcare.gov    Help Enrolling in [facility]  [place]  ([phone] [phone] (TOLL-FREE)  ([phone] (TTY)  Web:  Http://www.coverva.org    Local Help Enrolling in the [facility]  [place] [facility]  [phone] (MAIN)  Email:  health-help@nvfs .org  Web:  BlackjackMyths.is  Address:  [address]  [address] [place], [place] [address]    SEDATING MEDICATIONS  Sedating medications include strong pain medications (e.g. narcotics), muscle relaxers, benzodiazepines (used for anxiety and as muscle relaxers), Benadryl/diphenhydramine and other antihistamines for allergic reactions/itching, and other medications.  If you are unsure if you have received a sedating medication, please ask your physician or nurse.  If you received a sedating  medication: DO NOT drive a car. DO NOT operate machinery. DO NOT perform jobs where you need to be alert.  DO NOT drink alcoholic beverages while taking this medicine.     If you get dizzy, sit or lie down at the first signs. Be careful going up and down stairs.  Be extra careful to prevent falls.     Never give this medicine to others.     Keep this medicine out of reach of children.     Do not take or save old medicines. Throw them away when outdated.     Keep all medicines in a cool, dry place. DO NOT keep them in your bathroom medicine cabinet or in a cabinet above the stove.    MEDICATION REFILLS  Please be aware that we cannot refill any prescriptions through the ER. If you need further treatment from what is provided at your ER visit, please follow up with your primary care doctor or your pain management specialist.    [place]  Did you know [facility] has two freestanding ERs located just a few miles away?  [facility] [place] have short wait times, easy free parking directly in front of the building and top patient satisfaction scores - and the same Board Certified Emergency Medicine doctors as [facility]

## 2022-08-17 NOTE — ED Provider Notes (Signed)
[facility] EMERGENCY DEPARTMENT  ATTENDING PHYSICIAN HISTORY AND PHYSICAL EXAM     Patient Name: Kathy Brewer, Kathy Brewer  Department:[facility] EMERGENCY DE[facility]  Encounter Date:  08/16/2022  Attending Physician: Randol Kern, MD   Age: 26 y.o. female  Patient Room: S 14/S 14  PCP: Benjaman Kindler, PA           Diagnosis/Disposition:     Final diagnoses:   Gastritis without bleeding, unspecified chronicity, unspecified gastritis type   Gastroesophageal reflux disease, unspecified whether esophagitis present       ED Disposition       ED Disposition   Discharge    Condition   --    Date/Time   Tue Aug 17, 2022 12:45 AM    Comment   Assunta Gambles discharge to home/self care.    Condition at disposition: Stable                 Follow-Up Providers (if applicable)    [facility]  [address]  [place] [address]  Schedule an appointment as soon as possible for a visit   for further evaluation       Discharge Medication List as of 08/17/2022 12:45 AM              Medical Decision Making:     Initial Differential Diagnosis:  Initial differential diagnosis to include but not limited to: GERD, esophageal spasm, panic attack    Plan:  Labs, EKG    Final Impression:  The patient is a 26 year old who presents with likely acid reflux and gastritis.  Patient has a history of GERD and IBS.  Patient states that she does not like to take PPIs as she thinks she is allergic to them and anytime she tries a new therapy, she has a panic attack.  She denies alcohol or drug use.  Has been maintaining fairly appropriate GERD diet.  Denies SI or HI.  Labs here okay.  EKG normal.  Vital signs okay as well.  Low suspicion for ACS.  Chest x-ray without obvious consolidation or pneumothorax.  Patient refusing PPI/Pepcid or Maalox here.  Continues to report a globus sensation anytime she tries anything new.  Was very keen on getting fluids so was given 250 cc of fluid and will be discharged to follow-up  with GI.  The patient was deemed stable for discharge. They were given strict return precautions as it relates to their presumed diagnosis, verbalized understanding of these precautions and agreed to follow up as instructed. All questions were answered prior to discharge.         Medical Decision Making  Amount and/or Complexity of Data Reviewed  ECG/medicine tests: ordered.                            History of Presenting Illness:     Nursing Triage note: Pt wheeled into triage, appears anxious c/o left chest "aches" radiating to LUQ abd for a couple of days. Pt also states shes been feeling dizzy intermittent specially when standing and walking.  Chief complaint: Chest Pain and Abdominal Pain    Kathy Brewer is a 26 y.o. female with a past medical history of anxiety, GERD, and IBS who presents with a few days of constant left chest pain that worsens at night. She also reports mid-sternal chest spasms and belching.    She had been difficulty having bowel  movements so she took magnesium. Kathy Brewer has tried weaker medications in the past with little relief. When she tried PPI in the past she felt like her throat was closing.    Denies nausea, vomiting, smoking, birth control with estrogen/IUD, spicy foods, acidic foods.        Review of Systems:  Physical Exam:     Review of Systems    Positive and negative ROS per above and in HPI. All other systems reviewed and negative.     Pulse 99  BP 116/62  Resp 19  SpO2 92 %  Temp 99.1 F (37.3 C)     Physical Exam  Vitals and nursing note reviewed.   Constitutional:       General: She is not in acute distress.     Appearance: Normal appearance. She is not ill-appearing.   HENT:      Head: Normocephalic and atraumatic.      Right Ear: External ear normal.      Left Ear: External ear normal.      Nose: Nose normal.      Mouth/Throat:      Mouth: Mucous membranes are moist.   Cardiovascular:      Rate and Rhythm: Normal rate and regular rhythm.      Pulses: Normal  pulses.      Heart sounds: Normal heart sounds.   Pulmonary:      Effort: Pulmonary effort is normal. No respiratory distress.      Breath sounds: Normal breath sounds.   Abdominal:      General: Abdomen is flat. Bowel sounds are normal. There is no distension.      Tenderness: There is no abdominal tenderness.   Musculoskeletal:         General: No swelling, tenderness or deformity. Normal range of motion.   Skin:     General: Skin is warm and dry.      Capillary Refill: Capillary refill takes less than 2 seconds.   Neurological:      General: No focal deficit present.      Mental Status: She is oriented to person, place, and time.           Interpretations, Clinical Decision Tools and Critical Care:     O2 Sat:  The patient's oxygen saturation was 92 % on room air. This was independently interpreted by me as Normal.   Radiology:  I reviewed and independently interpreted the following radiology studies: Chest xray: No obvious consolidation or effusion. Heart size within normal limits.   EKG: I reviewed and Independently interpreted the patient's EKG as Normal sinus rhythm at rate 74 bpm. Normal axis. Normal intervals.   Cardiac Monitoring: I independely reviewed and interpreted the patient's rhythm strip as normal sinus at 92 bpm.         Procedures:   Procedures      Attestations:     Scribe Attestation: I am the first provider for this patient and I personally performed the services documented. Suzan Nailer is scribing for me on this chart. This note and the patient instructions accurately reflect work and decisions made by me.      Documentation Notes:  Parts of this note were generated by the Epic EMR system/ Dragon speech recognition and may contain inherent errors or omissions not intended by the user. Grammatical errors, random word insertions, deletions, pronoun errors and incomplete sentences are occasional consequences of this technology due to software limitations. Not all errors are caught or  corrected.  My documentation is often completed after the patient is no longer under my clinical care. In some cases, the Epic EMR may pull updated results into the above documentation which may not reflect all results or information that were available to me at the time of my medical decision making.   If there are questions or concerns about the content of this note or information contained within the body of this dictation they should be addressed directly with the author for clarification.                  Randol Kern, MD  08/17/22 [phone]

## 2022-08-17 NOTE — ED Notes (Signed)
Pt's family member hitting rapid response button in room, upon entering pt has home pulse ox on, while already on telemetry monitor in room, stating "please stop this now, it's making me feel so weird, something is wrong," in reference to normal saline infusing. Fluids stopped, MD Sherryll Burger aware.

## 2022-09-07 ENCOUNTER — Emergency Department
Admission: EM | Admit: 2022-09-07 | Discharge: 2022-09-07 | Disposition: A | Discharge status: Home / Self Care | Payer: Commercial Managed Care - PPO | Attending: Student in an Organized Health Care Education/Training Program | Admitting: Student in an Organized Health Care Education/Training Program

## 2022-09-07 ENCOUNTER — Emergency Department: Payer: Commercial Managed Care - PPO

## 2022-09-07 DIAGNOSIS — H6692 Otitis media, unspecified, left ear: Secondary | ICD-10-CM | POA: Insufficient documentation

## 2022-09-07 DIAGNOSIS — R42 Dizziness and giddiness: Secondary | ICD-10-CM | POA: Insufficient documentation

## 2022-09-07 DIAGNOSIS — R1310 Dysphagia, unspecified: Secondary | ICD-10-CM | POA: Insufficient documentation

## 2022-09-07 DIAGNOSIS — H6690 Otitis media, unspecified, unspecified ear: Secondary | ICD-10-CM

## 2022-09-07 DIAGNOSIS — R6884 Jaw pain: Secondary | ICD-10-CM | POA: Insufficient documentation

## 2022-09-07 LAB — BASIC METABOLIC PANEL
Anion Gap: 13 (ref 5.0–15.0)
BUN: 7 mg/dL (ref 7.0–21.0)
CO2: 17 mEq/L (ref 17–29)
Calcium: 9.6 mg/dL (ref 8.5–10.5)
Chloride: 107 mEq/L (ref 99–111)
Creatinine: 0.8 mg/dL (ref 0.4–1.0)
Glucose: 83 mg/dL (ref 70–100)
Potassium: 3.5 mEq/L (ref 3.5–5.3)
Sodium: 137 mEq/L (ref 135–145)
eGFR: >60 mL/min/{1.73_m2} (ref >=60)

## 2022-09-07 LAB — CBC AND DIFFERENTIAL
Absolute NRBC: 0 10*3/uL (ref 0.00–0.00)
Basophils Absolute Automated: 0.09 10*3/uL — ABNORMAL HIGH (ref 0.00–0.08)
Basophils Automated: 1.3 %
Eosinophils Absolute Automated: 0.06 10*3/uL (ref 0.00–0.44)
Eosinophils Automated: 0.9 %
Hematocrit: 35.9 % (ref 34.7–43.7)
Hgb: 12.2 g/dL (ref 11.4–14.8)
Immature Granulocytes Absolute: 0.01 10*3/uL (ref 0.00–0.07)
Immature Granulocytes: 0.1 %
Instrument Absolute Neutrophil Count: 3.45 10*3/uL (ref 1.10–6.33)
Lymphocytes Absolute Automated: 2.75 10*3/uL (ref 0.42–3.22)
Lymphocytes Automated: 40.9 %
MCH: 33.2 pg (ref 25.1–33.5)
MCHC: 34 g/dL (ref 31.5–35.8)
MCV: 97.8 fL — ABNORMAL HIGH (ref 78.0–96.0)
MPV: 11.4 fL (ref 8.9–12.5)
Monocytes Absolute Automated: 0.36 10*3/uL (ref 0.21–0.85)
Monocytes: 5.4 %
Neutrophils Absolute: 3.45 10*3/uL (ref 1.10–6.33)
Neutrophils: 51.4 %
Nucleated RBC: 0 /100 WBC (ref 0.0–0.0)
Platelets: 222 10*3/uL (ref 142–346)
RBC: 3.67 10*6/uL — ABNORMAL LOW (ref 3.90–5.10)
RDW: 13 % (ref 11–15)
WBC: 6.72 10*3/uL (ref 3.10–9.50)

## 2022-09-07 LAB — I-STAT WHOLE BLOOD B-HCG, TOTAL: i-stat Whole Blood B-HCG, Total: <5 IU/L

## 2022-09-07 MED ORDER — PREDNISONE 20 MG PO TABS
60.0000 mg | ORAL_TABLET | Freq: Once | ORAL | Status: AC
Start: 2022-09-07 — End: 2022-09-07
  Administered 2022-09-07: 60 mg via ORAL
  Filled 2022-09-07: qty 3

## 2022-09-07 MED ORDER — MECLIZINE HCL 12.5 MG PO TABS
25.0000 mg | ORAL_TABLET | Freq: Once | ORAL | Status: DC
Start: 2022-09-07 — End: 2022-09-07
  Filled 2022-09-07: qty 2

## 2022-09-07 MED ORDER — AMOXICILLIN-POT CLAVULANATE 875-125 MG PO TABS
1.0000 | ORAL_TABLET | Freq: Once | ORAL | Status: DC
Start: 2022-09-07 — End: 2022-09-07

## 2022-09-07 MED ORDER — IOHEXOL 350 MG/ML IV SOLN
100.0000 mL | Freq: Once | INTRAVENOUS | Status: AC | PRN
Start: 2022-09-07 — End: 2022-09-07
  Administered 2022-09-07: 100 mL via INTRAVENOUS

## 2022-09-07 MED ORDER — METOCLOPRAMIDE HCL 5 MG/ML IJ SOLN
10.0000 mg | Freq: Once | INTRAMUSCULAR | Status: DC
Start: 2022-09-07 — End: 2022-09-07
  Filled 2022-09-07: qty 2

## 2022-09-07 MED ORDER — CEFDINIR 300 MG PO CAPS
300.0000 mg | ORAL_CAPSULE | Freq: Once | ORAL | Status: AC
Start: 2022-09-07 — End: 2022-09-07
  Administered 2022-09-07: 300 mg via ORAL
  Filled 2022-09-07: qty 1

## 2022-09-07 MED ORDER — CEFDINIR 300 MG PO CAPS
300.0000 mg | ORAL_CAPSULE | Freq: Two times a day (BID) | ORAL | 0 refills | Status: AC
Start: 2022-09-07 — End: 2022-09-14

## 2022-09-07 NOTE — Discharge Instructions (Signed)
Dear Ms. Montez Morita:    Thank you for choosing the [facility] Emergency Department, the premier emergency department in the [place] area.  I hope your visit today was EXCELLENT. You will receive a survey via text message that will give you the opportunity to provide feedback to your team about your visit. Please do not hesitate to reach out with any questions!    Specific instructions for your visit today:    - Take cefdinir twice daily for ear infection   - For pain, you can take [phone] milligrams of Tylenol every 6 hours (not to exceed 4000 milligrams in any 24-hour period) and 400-600 milligrams of ibuprofen or any other NSAID such as Motrin or Advil every 6 hours (you may stop taking these if you develop a burning pain in the upper left part of your belly). You can alternate these medications every 3 hours (eg. Tylenol at noon, ibuprofen at 3pm, Tylenol again at 6pm, etc.).      IF YOU DO NOT CONTINUE TO IMPROVE OR YOUR CONDITION WORSENS, PLEASE CONTACT YOUR DOCTOR OR RETURN IMMEDIATELY TO THE EMERGENCY DEPARTMENT.    Sincerely,  Evangelyne Loja, Loleta Rose, MD  Attending Emergency Physician  [facility] Emergency Department      OBTAINING A PRIMARY CARE APPOINTMENT    Primary care physicians (PCPs, also known as primary care doctors) are either internists or family medicine doctors. Both types of PCPs focus on health promotion, disease prevention, patient education and counseling, and treatment of acute and chronic medical conditions.    If you need a primary care doctor, please call the below number and ask who is receiving new patients.     [facility]  Telephone:  [phone]  [email or web address]    DOCTOR REFERRALS  Call [phone] (available 24 hours a day, 7 days a week) if you need any further referrals and we can help you find a primary care doctor or specialist.  Also, available online at:  [email or web address]    YOUR CONTACT INFORMATION  Before leaving please check  with registration to make sure we have an up-to-date contact number.  You can call registration at [phone] to update your information.  For questions about your hospital bill, please call [phone].  For questions about your Emergency Dept Physician bill please call [phone].      FREE HEALTH SERVICES  If you need help with health or social services, please call 2-1-1 for a free referral to resources in your area.  2-1-1 is a free service connecting people with information on health insurance, free clinics, pregnancy, mental health, dental care, food assistance, housing, and substance abuse counseling.  Also, available online at:  http://www.[place].org    ORTHOPEDIC INJURY   Please know that significant injuries can exist even when an initial x-ray is read as normal or negative.  This can occur because some fractures (broken bones) are not initially visible on x-rays.  For this reason, close outpatient follow-up with your primary care doctor or bone specialist (orthopedist) is required.    MEDICATIONS AND FOLLOWUP  Please be aware that some prescription medications can cause drowsiness.  Use caution when driving or operating machinery.    The examination and treatment you have received in our Emergency Department is provided on an emergency basis, and is not intended to be a substitute for your primary care physician.  It is important that your doctor checks you again and that you report any new or remaining problems at that time.  ASSISTANCE WITH INSURANCE    Affordable Care Act  [facility])  Call to start or finish an application, compare plans, enroll or ask a question.  [place]: [phone]  Web:  Healthcare.gov    Help Enrolling in [place]  ([phone] [phone] (TOLL-FREE)  519-110-9101 (TTY)  Web:  Http://www.coverva.org    Local Help Enrolling in the [place]  (425)250-5553 (MAIN)  Email:  health-help'@nvfs'$ .org  Web:   http://lewis-perez.info/  Address:  [address], [address] [place],  [address]    SEDATING MEDICATIONS  Sedating medications include strong pain medications (e.g. narcotics), muscle relaxers, benzodiazepines (used for anxiety and as muscle relaxers), Benadryl/diphenhydramine and other antihistamines for allergic reactions/itching, and other medications.  If you are unsure if you have received a sedating medication, please ask your physician or nurse.  If you received a sedating medication: DO NOT drive a car. DO NOT operate machinery. DO NOT perform jobs where you need to be alert.  DO NOT drink alcoholic beverages while taking this medicine.     If you get dizzy, sit or lie down at the first signs. Be careful going up and down stairs.  Be extra careful to prevent falls.     Never give this medicine to others.     Keep this medicine out of reach of children.     Do not take or save old medicines. Throw them away when outdated.     Keep all medicines in a cool, dry place. DO NOT keep them in your bathroom medicine cabinet or in a cabinet above the stove.    MEDICATION REFILLS  Please be aware that we cannot refill any prescriptions through the ER. If you need further treatment from what is provided at your ER visit, please follow up with your primary care doctor or your pain management specialist.    [place]  Did you know [facility] has two freestanding ERs located just a few miles away?  [facility] [place] [facility] [place] have short wait times, easy free parking directly in front of the building and top patient satisfaction scores - and the same Board Certified Emergency Medicine doctors as [facility].

## 2022-09-07 NOTE — ED Triage Notes (Signed)
C/o dizziness since last night, feels like environment is spinning, denies N/V, SOB. States that it feels like she's gonna pass out. Alert and oriented x4. No numbness or tingling sensation of extremities.

## 2022-09-07 NOTE — ED Provider Notes (Signed)
[facility] EMERGENCY DEPARTMENT  ATTENDING PHYSICIAN HISTORY AND PHYSICAL EXAM     Patient Name: Kathy Brewer, Kathy Brewer  Department:[facility] EMERGENCY DEPT  Encounter Date:  09/07/2022  Attending Physician: Tiajuana Amass, MD   Age: 26 y.o. female  Patient Room: N RW06/N RW06  PCP: Benjaman Kindler, PA           Diagnosis/Disposition:     Final diagnoses:   Acute otitis media, unspecified otitis media type       ED Disposition       ED Disposition   Discharge    Condition   --    Date/Time   Tue Sep 07, 2022  8:51 AM    Comment   Assunta Gambles discharge to home/self care.    Condition at disposition: Stable                 Follow-Up Providers (if applicable)    No follow-up provider specified.     New Prescriptions    CEFDINIR (OMNICEF) 300 MG CAPSULE    Take 1 capsule (300 mg) by mouth 2 (two) times daily for 7 days           Medical Decision Making:       ED Course as of 09/07/22 0856   Tue Sep 07, 2022   0713 Ddx includes OM, OE, deep space neck infection, ludwigs angina, labyrithitis. Patient here with multiple complaints - L ear pain, difficulty swallowing, pain submandibular, dizziness. L TM does look erythematous - OM. Will do CT soft tissue neck to eval for deep space neck infection although clinically exam reassuring.  [AZ]   7654 Patient refused meds multiple times  [AZ]   0841 CT Soft Tissue Neck with Contrast    IMPRESSION:      1.Normal appearing examination. [AZ]   [id number] CT neck reassuring. Suspect otitis media. Allergic to penicillins - will give cefdinir. Will give a one time dose of pred for inflammation given discomfort. Discussed supportive care and return precautions. Stable for [place].  [AZ]      ED Course User Index  [AZ] Bosie Clos Loleta Rose, MD       Medical Decision Making  Amount and/or Complexity of Data Reviewed  Radiology: ordered. Decision-making details documented in ED Course.    Risk  Prescription drug management.                            History of Presenting Illness:     Nursing Triage note: In [facility]  c/o posterior head pressure, dizziness, and difficulty swallowing x3 days. Endorsing vision changes, "feels like everything is moving." Reports numbness/tingling to bilateral hands and feet. Hx similar s/s but not as bad. Denies nausea, SOB, or fevers.  Chief complaint: Headache, Dizziness, and Dysphagia    Kathy Brewer is a 26 y.o. female here with multiple complaints. L ear pain, difficulty swallowing, pain in the submandibular region, dizziness x 2 days. Hasn't tried meds. Also c/o pressure posterior head and neck stiffness. No fever.           Review of Systems:  Physical Exam:     Review of Systems    Positive and negative ROS per above and in HPI. All other systems reviewed and negative.     Pulse 85  BP 106/69  Resp 20  SpO2 100 %  Temp 98.6 F (37 C)     Physical Exam    Nursing notes  and vital signs reviewed    General: Patient in no acute distress, overall appears uncomfortable but nontoxic  Head: Normocephalic, atraumatic  Neck: supple. Full ROM without meningismus.   Eyes: No scleral icterus  Ears/Nose/Throat: Moist mucous membranes, airway is patent, no facial swelling. L TM + erythema and bulging, R TM clear. No mastoid tenderness or skin changes. Oropharynx clear without edema or erythema. No submandibular induration but patient does say is tender. Sublingual region soft.   Cardiovascular: Regular rate and rhythm with no murmurs/rubs appreciated, no LE swelling  Pulmonary: Normal respiratory effort, CTAB w/o wheezes/crackles  GI: Abdomen is soft, non-distended, and non-tender.  Peritoneal signs are absent.  Back: No gross deformities.  Musculoskeletal: No gross injuries or deformities.  Neurological: Alert and oriented x3, face symmetric, hearing intact to voice, speech normal, no gross deficits  Psych: Behaving appropriately  Skin: Warm, dry, no rashes          Interpretations, Clinical Decision Tools and Critical Care:     O2 Sat:  The patient's oxygen saturation was 100 % on room air.  This was independently interpreted by me as Normal.                Procedures:   Procedures      Attestations:     Scribe Attestation: There was no scribe involved in the care of this patient.     Documentation Notes:  Parts of this note were generated by the Epic EMR system/ Dragon speech recognition and may contain inherent errors or omissions not intended by the user. Grammatical errors, random word insertions, deletions, pronoun errors and incomplete sentences are occasional consequences of this technology due to software limitations. Not all errors are caught or corrected.  My documentation is often completed after the patient is no longer under my clinical care. In some cases, the Epic EMR may pull updated results into the above documentation which may not reflect all results or information that were available to me at the time of my medical decision making.   If there are questions or concerns about the content of this note or information contained within the body of this dictation they should be addressed directly with the author for clarification.                  Tiajuana AmassZeke, Shaheer Bonfield M, MD  09/07/22 [phone]

## 2022-09-07 NOTE — ED Notes (Signed)
Patient offered meclizine for symptom relief, patient refusing medication for 2nd time. MD updated and aware.

## 2022-09-15 ENCOUNTER — Emergency Department
Admission: EM | Admit: 2022-09-15 | Discharge: 2022-09-15 | Disposition: A | Discharge status: Home / Self Care | Payer: Commercial Managed Care - PPO | Attending: Emergency Medicine | Admitting: Emergency Medicine

## 2022-09-15 DIAGNOSIS — K2900 Acute gastritis without bleeding: Secondary | ICD-10-CM | POA: Insufficient documentation

## 2022-09-15 HISTORY — DX: Gastritis, unspecified, without bleeding: K29.70

## 2022-09-15 LAB — HEPATIC FUNCTION PANEL
ALT: 8 U/L (ref 0–55)
AST (SGOT): 13 U/L (ref 5–41)
Albumin/Globulin Ratio: 1.4 (ref 0.9–2.2)
Albumin: 4.1 g/dL (ref 3.5–5.0)
Alkaline Phosphatase: 65 U/L (ref 37–117)
Bilirubin Direct: 0.3 mg/dL (ref 0.0–0.5)
Bilirubin Indirect: 0.8 mg/dL (ref 0.2–1.0)
Bilirubin, Total: 1.1 mg/dL (ref 0.2–1.2)
Globulin: 3 g/dL (ref 2.0–3.6)
Protein, Total: 7.1 g/dL (ref 6.0–8.3)

## 2022-09-15 LAB — CBC AND DIFFERENTIAL
Absolute NRBC: 0 10*3/uL (ref 0.00–0.00)
Basophils Absolute Automated: 0.08 10*3/uL (ref 0.00–0.08)
Basophils Automated: 1.6 %
Eosinophils Absolute Automated: 0.06 10*3/uL (ref 0.00–0.44)
Eosinophils Automated: 1.2 %
Hematocrit: 35.8 % (ref 34.7–43.7)
Hgb: 12.1 g/dL (ref 11.4–14.8)
Immature Granulocytes Absolute: 0.01 10*3/uL (ref 0.00–0.07)
Immature Granulocytes: 0.2 %
Instrument Absolute Neutrophil Count: 2.53 10*3/uL (ref 1.10–6.33)
Lymphocytes Absolute Automated: 1.86 10*3/uL (ref 0.42–3.22)
Lymphocytes Automated: 37.9 %
MCH: 32.8 pg (ref 25.1–33.5)
MCHC: 33.8 g/dL (ref 31.5–35.8)
MCV: 97 fL — ABNORMAL HIGH (ref 78.0–96.0)
MPV: 11.1 fL (ref 8.9–12.5)
Monocytes Absolute Automated: 0.37 10*3/uL (ref 0.21–0.85)
Monocytes: 7.5 %
Neutrophils Absolute: 2.53 10*3/uL (ref 1.10–6.33)
Neutrophils: 51.6 %
Nucleated RBC: 0 /100 WBC (ref 0.0–0.0)
Platelets: 199 10*3/uL (ref 142–346)
RBC: 3.69 10*6/uL — ABNORMAL LOW (ref 3.90–5.10)
RDW: 13 % (ref 11–15)
WBC: 4.91 10*3/uL (ref 3.10–9.50)

## 2022-09-15 LAB — BASIC METABOLIC PANEL
Anion Gap: 10 (ref 5.0–15.0)
BUN: 7 mg/dL (ref 7.0–21.0)
CO2: 20 mEq/L (ref 17–29)
Calcium: 9.6 mg/dL (ref 8.5–10.5)
Chloride: 108 mEq/L (ref 99–111)
Creatinine: 0.9 mg/dL (ref 0.4–1.0)
Glucose: 80 mg/dL (ref 70–100)
Potassium: 3.4 mEq/L — ABNORMAL LOW (ref 3.5–5.3)
Sodium: 138 mEq/L (ref 135–145)
eGFR: >60 mL/min/{1.73_m2} (ref >=60)

## 2022-09-15 LAB — I-STAT WHOLE BLOOD B-HCG, TOTAL: i-stat Whole Blood B-HCG, Total: <5 IU/L

## 2022-09-15 LAB — LIPASE: Lipase: 29 U/L (ref 8–78)

## 2022-09-15 MED ORDER — ALUM & MAG HYDROXIDE-SIMETH 200-200-20 MG/5ML PO SUSP
20.0000 mL | Freq: Once | ORAL | Status: AC
Start: 2022-09-15 — End: 2022-09-15
  Administered 2022-09-15: 20 mL via ORAL
  Filled 2022-09-15: qty 30

## 2022-09-15 MED ORDER — FAMOTIDINE 20 MG PO TABS
20.0000 mg | ORAL_TABLET | Freq: Two times a day (BID) | ORAL | 0 refills | Status: AC
Start: 2022-09-15 — End: 2022-09-22

## 2022-09-15 MED ORDER — SODIUM CHLORIDE 0.9 % IV BOLUS
1000.0000 mL | Freq: Once | INTRAVENOUS | Status: DC
Start: 2022-09-15 — End: 2022-09-15

## 2022-09-15 MED ORDER — SUCRALFATE 1 G PO TABS
1.0000 g | ORAL_TABLET | Freq: Once | ORAL | Status: AC
Start: 2022-09-15 — End: 2022-09-15
  Administered 2022-09-15: 1 g via ORAL
  Filled 2022-09-15: qty 1

## 2022-09-15 MED ORDER — FAMOTIDINE 10 MG/ML IV SOLN (WRAP)
20.0000 mg | Freq: Once | INTRAVENOUS | Status: AC
Start: 2022-09-15 — End: 2022-09-15
  Administered 2022-09-15: 20 mg via INTRAVENOUS
  Filled 2022-09-15: qty 2

## 2022-09-15 MED ORDER — FAMOTIDINE 20 MG PO TABS
20.0000 mg | ORAL_TABLET | Freq: Once | ORAL | Status: AC
Start: 2022-09-15 — End: 2022-09-15
  Administered 2022-09-15: 20 mg via ORAL
  Filled 2022-09-15: qty 1

## 2022-09-15 MED ORDER — SUCRALFATE 1 G PO TABS
1.0000 g | ORAL_TABLET | Freq: Four times a day (QID) | ORAL | 0 refills | Status: AC
Start: 2022-09-15 — End: 2022-09-22

## 2022-09-15 MED ORDER — ONDANSETRON HCL 4 MG/2ML IJ SOLN
4.0000 mg | Freq: Once | INTRAMUSCULAR | Status: DC
Start: 2022-09-15 — End: 2022-09-15

## 2022-09-15 NOTE — Discharge Instructions (Signed)
Dear Ms. Montez Morita:    Thank you for choosing the [facility] Emergency Department, the premier emergency department in the [place] area.  I hope your visit today was EXCELLENT. You will receive a survey via text message that will give you the opportunity to provide feedback to your team about your visit. Please do not hesitate to reach out with any questions!    Specific instructions for your visit today:    Gastritis     You have been diagnosed with gastritis.     Gastritis is an irritation of the stomach lining. It has a number of causes including the use of aspirin and other anti-inflammatory medicines like ibuprofen (Advil or Motrin), naproxen (Naprosyn or Aleve), etc. Other causes are infections and too much stomach acid. Drinking alcohol can also cause gastritis.     You may be prescribed medicines to lower the amount of acid in the stomach or otherwise protect the stomach lining.     Use an antacid (Maalox and Mylanta) as directed on the bottle (1-2 tablespoons or 5-10 ml four times a day).     Avoid caffeine and alcohol. Avoid aspirin and other anti-inflammatory medicines like ibuprofen (Advil or Motrin), naproxen (Naprosyn or Aleve), etc. Acetaminophen (Tylenol) is safe for your stomach.     Spicy foods and acidic foods may increase pain. However, they do not damage the stomach lining. Avoid these foods if they cause pain.     You may be referred to a stomach specialist Psychologist, sport and exercise) for further evaluation.     YOU SHOULD SEEK MEDICAL ATTENTION IMMEDIATELY, EITHER HERE OR AT THE NEAREST EMERGENCY DEPARTMENT, IF ANY OF THE FOLLOWING OCCURS:  Your pain suddenly gets worse.  You vomit (throw up) repeatedly or vomit blood or material that looks like "coffee grounds."  Any blood is in your stool or your stool gets very dark or looks like tar.  You develop a fever (temperature higher than 100.63F / 38C) or shaking chills.                 IF YOU DO NOT CONTINUE TO IMPROVE OR YOUR CONDITION  WORSENS, PLEASE CONTACT YOUR DOCTOR OR RETURN IMMEDIATELY TO THE EMERGENCY DEPARTMENT.    Sincerely,  Sherrie Sport, PA-C &  [name], MD  Attending Emergency Physician  [facility] Emergency Department    ONSITE PHARMACY  Our full service onsite pharmacy is located in the ER waiting room.  Open 7 days a week from 9 am to 9 pm.  We accept all major insurances and prices are competitive with major retailers.  Ask your provider to print your prescriptions down to the pharmacy to speed you on your way home.    OBTAINING A PRIMARY CARE APPOINTMENT    Primary care physicians (PCPs, also known as primary care doctors) are either internists or family medicine doctors. Both types of PCPs focus on health promotion, disease prevention, patient education and counseling, and treatment of acute and chronic medical conditions.    If you need a primary care doctor, please call the below number and ask who is receiving new patients.     [facility]  Telephone:  [phone]  [email or web address]    DOCTOR REFERRALS  Call [phone] (available 24 hours a day, 7 days a week) if you need any further referrals and we can help you find a primary care doctor or specialist.  Also, available online at:  [email or web address]    YOUR CONTACT INFORMATION  Before  leaving please check with registration to make sure we have an up-to-date contact number.  You can call registration at [phone] to update your information.  For questions about your hospital bill, please call [phone].  For questions about your Emergency Dept Physician bill please call [phone].      [place]  If you need help with health or social services, please call 2-1-1 for a free referral to resources in your area.  2-1-1 is a free service connecting people with information on health insurance, free clinics, pregnancy, mental health, dental care, food assistance, housing, and substance abuse counseling.   Also, available online at:  http://www.[place].org    MEDICAL RECORDS AND TESTS  Certain laboratory test results do not come back the same day, for example urine cultures.   We will contact you if other important findings are noted.  Radiology films are often reviewed again to ensure accuracy.  If there is any discrepancy, we will notify you.      Please call [phone] to pick up a complimentary CD of any radiology studies performed.  If you or your doctor would like to request a copy of your medical records, please call [phone].      ORTHOPEDIC INJURY   Please know that significant injuries can exist even when an initial x-ray is read as normal or negative.  This can occur because some fractures (broken bones) are not initially visible on x-rays.  For this reason, close outpatient follow-up with your primary care doctor or bone specialist (orthopedist) is required.    MEDICATIONS AND FOLLOWUP  Please be aware that some prescription medications can cause drowsiness.  Use caution when driving or operating machinery.    The examination and treatment you have received in our Emergency Department is provided on an emergency basis, and is not intended to be a substitute for your primary care physician.  It is important that your doctor checks you again and that you report any new or remaining problems at that time.      [place]  The nearest 24 hour pharmacy is:    [facility] [place], [place] [address]  [place]  [facility])  Call to start or finish an application, compare plans, enroll or ask a question.  [place]: [phone]  Web:  Healthcare.gov    Help Enrolling in [place]  712-008-9804 (TOLL-FREE)  [phone] (TTY)  Web:  Http://www.coverva.org    Local Help Enrolling in the [place]  5078528811 (MAIN)  Email:  health-help@nvfs .org  Web:   http://lewis-perez.info/  Address:  [address] [place], [place] [address]    SEDATING MEDICATIONS  Sedating medications include strong pain medications (e.g. narcotics), muscle relaxers, benzodiazepines (used for anxiety and as muscle relaxers), Benadryl/diphenhydramine and other antihistamines for allergic reactions/itching, and other medications.  If you are unsure if you have received a sedating medication, please ask your physician or nurse.  If you received a sedating medication: DO NOT drive a car. DO NOT operate machinery. DO NOT perform jobs where you need to be alert.  DO NOT drink alcoholic beverages while taking this medicine.     If you get dizzy, sit or lie down at the first signs. Be careful going up and down stairs.  Be extra careful to prevent falls.  Never give this medicine to others.     Keep this medicine out of reach of children.     Do not take or save old medicines. Throw them away when outdated.     Keep all medicines in a cool, dry place. DO NOT keep them in your bathroom medicine cabinet or in a cabinet above the stove.    MEDICATION REFILLS  Please be aware that we cannot refill any prescriptions through the ER. If you need further treatment from what is provided at your ER visit, please follow up with your primary care doctor or your pain management specialist.    [place]  Did you know [facility] has two freestanding ERs located just a few miles away?  [place] [facility] [place] [facility] [place] have short wait times, easy free parking directly in front of the building and top patient satisfaction scores - and the same Board Certified Emergency Medicine doctors as [facility].

## 2022-09-15 NOTE — ED Provider Notes (Signed)
History     Chief Complaint   Patient presents with    Abdominal Pain     HPI     Clinical Information & ED Course    HPI:    Kathy Brewer is a 26 y.o. female who presents with upper abdominal pain x 6 day(s). Pt describes the pain as burning and states it started after taking 60 mg of Prednisone. Pt states the pain is constant. Pt states the pain does not radiate. Pt c/o constipation and back pain. Pt denies fever, nausea, vomiting, diarrhea, dysuria, and hematuria. Pt took TUMS at home for symptoms with no relief. Pt states she is currently on Omeprazole for GERD.     PMHx: Anxiety, arthritis, asthma, depression, GERD, IBS    Exam: NAD, non-toxic appearing. Heart RRR, Lungs CTA bilateral. Abdomen soft, TTP to upper abdomen. No CVA tenderness.     Plan: Will order labs. Will administer Maalox, IVFs, Zofran, Pepcid, and Carafate.     Results:     CBC-within normal limits  BMP_-WNL  Hepatic Function Panel- WNL  Lipase- WNL at 29  iSTAT bHCG- <5.0      ED Course:     ED Course as of 09/15/22 1442   Wed Sep 15, 2022   1344 Pt reports mild improvement but still has burning in her abdomen. Pt states the Maalox caused her "throat to close up" and pt requested the IV Pepcid be stopped. Pt has pulse ox of 100%, swallowing own secretions with ease, no posterior oropharynx edema. Pt is agreeable to Pepcid PO.  [CM]      ED Course User Index  [CM] Eula Flax, [place]     Patient feels improved with p.o. Pepcid. Reviewed all results with pt. Will discharge pt home with Pepcid and Carafate. Instructed pt to follow up with gastroenterology. Instructed pt to return to the ED for worsening symptoms, including but not limited to, increased pain, fever, vomiting. Pt expresses understanding.        Home:   - No significant concern on exam to warrant further testing. Agrees with plan of care for home. Agrees to follow up with provider as directed. Will return to the ED if new or concern symptoms develop.   - At  discharge, pt looked well, non-toxic, no distress, and is a good candidate for outpatient follow up. No signs of toxicity to suggest need for further labs, imaging, or for admission  - All questions were answered.     Reviewed case with attending, Dr. Mel Almond, who agrees with treatment plan.     Medical Decision Making    Differential diagnosis includes but is not limited to: GERD, Gastritis, Cholelithiasis, Cholecystitis, Choledocholithiasis, Pancreatitis    MDM: 26 y.o. female who presents with upper abdominal pain x 6 day(s).  Patient describes pain as burning and it started after steroids.  High suspicion for gastritis.  Labs are within normal limits.  Patient's pain improved with Carafate and p.o. Pepcid.  Will discharge home with similar.  Recommend outpatient follow-up with gastroenterology.    Attestations    Documentation Notes:   *Parts of this note were generated by the Epic EMR system/ Dragon speech recognition and may contain inherent errors or omissions not intended by the user. Grammatical errors, random word insertions, deletions, pronoun errors and incomplete sentences are occasional consequences of this technology due to software limitations. Not all errors are caught or corrected.     My documentation is often completed after the  patient is no longer under my clinical care. In some cases, the Epic EMR may pull updated results into the above documentation which may not reflect all results or information that was available to me at the time of my medical decision making.      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 No pertinent surgical history.    Family History   Problem Relation Age of Onset    Anxiety disorder Brother     Schizophrenia Maternal Grandmother        Social  Social History     Tobacco Use    Smoking status: Never    Smokeless tobacco: Never   Vaping Use    Vaping Use: Never used   Substance Use Topics    Alcohol use: Never    Drug use: Never       .     Allergies   Allergen Reactions    Bactrim [Sulfamethoxazole-Trimethoprim]     Singulair [Montelukast]      Numbness in fingers and toes    Toradol [Ketorolac Tromethamine]     Penicillins Rash       Home Medications       Med List Status: Complete Set By: Jeneen Montgomery, LPN at 16/07/9603 12:27 PM          Flagged for Removal               cefdinir (OMNICEF) 300 MG capsule (Expired)     Take 1 capsule (300 mg) by mouth 2 (two) times daily for 7 days     Patient taking differently: Take 1 capsule (300 mg) by mouth 2 (two) times daily Patient reports not taking currently     clonazePAM (KlonoPIN) 0.5 MG tablet     Take 0.5 mg by mouth 2 (two) times daily as needed     escitalopram (LEXAPRO) 10 MG tablet     For the 1st 7 days take 1/2 tab by mouth every morning.  If tolerated increase to 1 tab daily.     pantoprazole (PROTONIX) 40 MG tablet     Take 40 mg by mouth daily             Review of Systems   Constitutional:  Negative for chills, diaphoresis and fever.   HENT:  Negative for congestion, drooling, facial swelling, rhinorrhea, sore throat and trouble swallowing.    Eyes:  Negative for pain and discharge.   Respiratory:  Negative for cough, chest tightness and shortness of breath.    Cardiovascular:  Negative for chest pain.   Gastrointestinal:  Positive for abdominal pain and constipation. Negative for diarrhea, nausea and vomiting.   Genitourinary:  Negative for dysuria.   Musculoskeletal:  Positive for back pain. Negative for gait problem and neck stiffness.   Skin:  Negative for rash.    Neurological:  Negative for dizziness, weakness, light-headedness, numbness and headaches.  Psychiatric/Behavioral:  Negative for agitation.    All other systems reviewed and are negative.      Physical Exam    BP: 112/73, Heart Rate: (!) 107, Temp: 98 F (36.7 C), Resp Rate: 16, SpO2: 99 %, Weight: 63.5 kg    Physical Exam  Vitals and nursing note reviewed.   Constitutional:       Appearance: Normal appearance. She is well-developed and well-groomed. She is not ill-appearing, toxic-appearing or diaphoretic.   HENT:      Head: Normocephalic and atraumatic.   Cardiovascular:      Rate and Rhythm: Normal rate and regular rhythm.   Pulmonary:      Effort: Pulmonary effort is normal.      Breath sounds: Normal breath sounds.   Abdominal:      General: Abdomen is flat.      Palpations: Abdomen is soft.      Tenderness: There is abdominal tenderness in the right upper quadrant, epigastric area and left upper quadrant. There is no right CVA tenderness or left CVA tenderness.   Musculoskeletal:      Cervical back: Normal range of motion. No rigidity.      Comments: Moving all extremities equally and with ease.    Skin:     General: Skin is warm and dry.   Neurological:      Mental Status: She is alert and oriented to person, place, and time.   Psychiatric:         Behavior: Behavior is cooperative.           MDM and ED Course     ED Medication Orders (From admission, onward)      Start Ordered     Status Ordering Provider    09/15/22 1345 09/15/22 1344  famotidine (PEPCID) tablet 20 mg  Once        Route: Oral  Ordered Dose: 20 mg       Last MAR action: Given Shannan Slinker R    09/15/22 1209 09/15/22 1208  sucralfate (CARAFATE) tablet 1 g  Once        Route: Oral  Ordered Dose: 1 g       Last MAR action: Given Marguarite Markov R    09/15/22 1158 09/15/22 1157  famotidine (PEPCID) injection 20 mg  Once        Route: Intravenous  Ordered Dose: 20 mg       Last MAR action: Given Sindhu Nguyen R    09/15/22 1158 09/15/22  1157  ondansetron (ZOFRAN) injection 4 mg  Once        Route: Intravenous  Ordered Dose: 4 mg       Last MAR action: Hold Damarys Speir R    09/15/22 1158 09/15/22 1157  sodium chloride 0.9 % bolus 1,000 mL  Once        Route: Intravenous  Ordered Dose: 1,000 mL       Last MAR action: Hold Hillarie Harrigan R    09/15/22 1158 09/15/22 1157  alum & mag hydroxide-simethicone (MAALOX PLUS) 200-200-20 mg/5 mL suspension 20 mL  Once        Route: Oral  Ordered Dose: 20 mL       Last MAR action: Given Zaharah Amir R               Medical Decision Making  Risk  OTC drugs.  Prescription drug management.          ED Course as of  09/15/22 1442   Wed Sep 15, 2022   1344 Pt reports mild improvement but still has burning in her abdomen. Pt states the Maalox caused her "throat to close up" and pt requested the IV Pepcid be stopped. Pt has pulse ox of 100%, swallowing own secretions with ease, no posterior oropharynx edema. Pt is agreeable to Pepcid PO.  [CM]      ED Course User Index  [CM] Eula Flax, [place]             Procedures    Clinical Impression & Disposition     Clinical Impression  Final diagnoses:   Acute gastritis without hemorrhage, unspecified gastritis type        ED Disposition       ED Disposition   Discharge    Condition   --    Date/Time   Wed Sep 15, 2022  2:19 PM    Comment   Mitzie Marlar discharge to home/self care.    Condition at disposition: Stable                  New Prescriptions    FAMOTIDINE (PEPCID) 20 MG TABLET    Take 1 tablet (20 mg) by mouth 2 (two) times daily for 7 days    SUCRALFATE (CARAFATE) 1 G TABLET    Take 1 tablet (1 g) by mouth 4 times daily with meals and at bedtime for 7 days                   Eula Flax, [place]  09/15/22 1442

## 2022-09-15 NOTE — ED Provider Notes (Signed)
[facility] EMERGENCY DEPARTMENT  ATTENDING PHYSICIAN ATTESTATION NOTE     Patient Name: Kathy Brewer, Kathy Brewer  Encounter Date:  09/15/2022  Room:  N RW03/N YT24  Attending: [name], MD  Patient DOB:  08/30/1996  Age: 26 y.o. female  MRN:  [id number]           Diagnosis/Disposition:     Final Impression  Final diagnoses:   Acute gastritis without hemorrhage, unspecified gastritis type     Disposition  ED Disposition       ED Disposition   Discharge    Condition   --    Date/Time   Wed Sep 15, 2022  2:19 PM    Comment   Shenae Bonanno discharge to home/self care.    Condition at disposition: Stable               Follow up  Gastroenterology    In 2 days      [facility] Emergency Dept  [address]  [place] [address]  [phone]    If symptoms worsen    Prescriptions  Discharge Medication List as of 09/15/2022  2:20 PM        START taking these medications    Details   famotidine (PEPCID) 20 MG tablet Take 1 tablet (20 mg) by mouth 2 (two) times daily for 7 days, Starting Wed 09/15/2022, Until Wed 09/22/2022, E-Rx      sucralfate (CARAFATE) 1 g tablet Take 1 tablet (1 g) by mouth 4 times daily with meals and at bedtime for 7 days, Starting Wed 09/15/2022, Until Wed 09/22/2022, E-Rx               MDM:      Final Impression:  The patient was deemed stable for discharge. They were given strict return precautions as it relates to their presumed diagnosis, verbalized understanding of these precautions and agreed to follow up as instructed. All questions were answered prior to discharge.    Medical Decision Making  Problems Addressed:  Acute gastritis without hemorrhage, unspecified gastritis type: complicated acute illness or injury    Risk  OTC drugs.  Prescription drug management.                 Clinical History:        Nursing Triage note: Pt ambulatory to triage reporting "severe burning" in her abdomen since she started taking prednisone and abx for ear infection she was diagnosed  with last week. Pt denies diarrhea but endorses slight red color on the toilet paper when she wipes. No active bleeding. Denies urinary symptoms. Pain increases when she lays down. Hx of gerd but this feels different.    Selected historical findings:  Malie Kashani is a 26 y.o. female with history of gastritis and gastric ulcer who presents with about 6 days of upper abdominal pain radiating to the back bilateral upper abdomen.  Symptoms began after she took 60 mg of prednisone that was prescribed for ear infection.  States she took 1 dose and has not taken any since then.  Pain is constant and patient has been taking Gaviscon and Tums with minimal relief.  Denies fever, vomiting, diarrhea, urinary symptoms.    Exam:  Physical Exam  Vitals and nursing note reviewed.   Constitutional:       General: She is not in acute distress.     Appearance: She is well-developed.   HENT:      Head: Normocephalic and  atraumatic.   Eyes:      Extraocular Movements: Extraocular movements intact.   Cardiovascular:      Rate and Rhythm: Normal rate.   Pulmonary:      Effort: Pulmonary effort is normal. No respiratory distress.   Abdominal:      Tenderness: There is abdominal tenderness in the right upper quadrant, epigastric area and left upper quadrant.   Musculoskeletal:         General: No deformity.      Cervical back: Normal range of motion and neck supple.   Neurological:      Mental Status: She is alert.         ED Course as of 09/27/22 2255   Wed Sep 15, 2022   1344 Pt reports mild improvement but still has burning in her abdomen. Pt states the Maalox caused her "throat to close up" and pt requested the IV Pepcid be stopped. Pt has pulse ox of 100%, swallowing own secretions with ease, no posterior oropharynx edema. Pt is agreeable to Pepcid PO.  [CM]      ED Course User Index  [CM] Eula FlaxMercer, Caitlin R, [place]        Interpretations, Results and Critical Care:       Laboratory results reviewed:  Abnormal Labs Reviewed   CBC  AND DIFFERENTIAL - Abnormal; Notable for the following components:       Result Value    RBC 3.69 (*)     MCV 97.0 (*)     All other components within normal limits             Procedures:     Procedures        ATTESTATIONS     Scribe Attestation: There was no scribe involved in the care of this patient.     Documentation Notes:  Parts of this note were generated by the Epic EMR system/ Dragon speech recognition and may contain inherent errors or omissions not intended by the user. Grammatical errors, random word insertions, deletions, pronoun errors and incomplete sentences are occasional consequences of this technology due to software limitations. Not all errors are caught or corrected.  My documentation is often completed after the patient is no longer under my clinical care. In some cases, the Epic EMR may pull updated results into the above documentation which may not reflect all results or information that were available to me at the time of my medical decision making.   If there are questions or concerns about the content of this note or information contained within the body of this dictation they should be addressed directly with the author for clarification.    Midlevel Provider Attestation:   The patient was seen and examined by the mid-level (resident, physician assistant or nurse practitioner) and the plan of care was discussed with me. I agree with the plan as it was presented to me.  I have reviewed and agree with the final ED diagnosis.   Please see the separately documented midlevel note for additional information including but not limited to full history of present illness, review of systems and comprehensive physical exam.                   Kathy LimesAppiah, Biran Brewer C, MD  09/27/22 2302

## 2022-09-15 NOTE — ED Notes (Signed)
I performed remote triage and initial testing was ordered based on presenting complaint. Care was expedited. I am not the primary provider for this patient.        Earlean Shawl, MD  09/15/22 [phone]

## 2023-08-25 ENCOUNTER — Observation Stay
Admission: EM | Admit: 2023-08-25 | Discharge: 2023-08-27 | Disposition: A | Discharge status: Home / Self Care | Payer: No Typology Code available for payment source | Attending: Hospitalist | Admitting: Hospitalist

## 2023-08-25 ENCOUNTER — Emergency Department: Payer: No Typology Code available for payment source

## 2023-08-25 DIAGNOSIS — R519 Headache, unspecified: Principal | ICD-10-CM | POA: Insufficient documentation

## 2023-08-25 DIAGNOSIS — I959 Hypotension, unspecified: Secondary | ICD-10-CM | POA: Insufficient documentation

## 2023-08-25 DIAGNOSIS — F411 Generalized anxiety disorder: Secondary | ICD-10-CM | POA: Insufficient documentation

## 2023-08-25 DIAGNOSIS — M25511 Pain in right shoulder: Secondary | ICD-10-CM | POA: Insufficient documentation

## 2023-08-25 DIAGNOSIS — F331 Major depressive disorder, recurrent, moderate: Secondary | ICD-10-CM | POA: Insufficient documentation

## 2023-08-25 DIAGNOSIS — R0789 Other chest pain: Secondary | ICD-10-CM | POA: Insufficient documentation

## 2023-08-25 DIAGNOSIS — K589 Irritable bowel syndrome without diarrhea: Secondary | ICD-10-CM | POA: Insufficient documentation

## 2023-08-25 DIAGNOSIS — F410 Panic disorder [episodic paroxysmal anxiety] without agoraphobia: Secondary | ICD-10-CM | POA: Insufficient documentation

## 2023-08-25 LAB — POCT PREGNANCY TEST, URINE HCG: POCT Pregnancy HCG Test, UR: NEGATIVE

## 2023-08-25 LAB — LAB USE ONLY - CBC WITH DIFFERENTIAL
Absolute Basophils: 0.07 10*3/uL (ref 0.00–0.08)
Absolute Eosinophils: 0.01 10*3/uL (ref 0.00–0.44)
Absolute Immature Granulocytes: 0.02 10*3/uL (ref 0.00–0.07)
Absolute Lymphocytes: 2.21 10*3/uL (ref 0.42–3.22)
Absolute Monocytes: 0.52 10*3/uL (ref 0.21–0.85)
Absolute Neutrophils: 5 10*3/uL (ref 1.10–6.33)
Absolute nRBC: 0 10*3/uL (ref <=0.00)
Basophils %: 0.9 %
Eosinophils %: 0.1 %
Hematocrit: 33.3 % — ABNORMAL LOW (ref 34.7–43.7)
Hemoglobin: 11.5 g/dL (ref 11.4–14.8)
Immature Granulocytes %: 0.3 %
Lymphocytes %: 28.2 %
MCH: 32.9 pg (ref 25.1–33.5)
MCHC: 34.5 g/dL (ref 31.5–35.8)
MCV: 95.1 fL (ref 78.0–96.0)
MPV: 11.1 fL (ref 8.9–12.5)
Monocytes %: 6.6 %
Neutrophils %: 63.9 %
Platelet Count: 197 10*3/uL (ref 142–346)
Preliminary Absolute Neutrophil Count: 5 10*3/uL (ref 1.10–6.33)
RBC: 3.5 10*6/uL — ABNORMAL LOW (ref 3.90–5.10)
RDW: 13 % (ref 11–15)
WBC: 7.83 10*3/uL (ref 3.10–9.50)
nRBC %: 0 /100{WBCs} (ref <=0.0)

## 2023-08-25 LAB — BASIC METABOLIC PANEL
Anion Gap: 10 (ref 5.0–15.0)
BUN: 8 mg/dL (ref 7–21)
CO2: 16 meq/L — ABNORMAL LOW (ref 17–29)
Calcium: 9.3 mg/dL (ref 8.5–10.5)
Chloride: 111 meq/L (ref 99–111)
Creatinine: 0.8 mg/dL (ref 0.4–1.0)
GFR: >60 mL/min/{1.73_m2} (ref >=60.0)
Glucose: 98 mg/dL (ref 70–100)
Potassium: 3.7 meq/L (ref 3.5–5.3)
Sodium: 137 meq/L (ref 135–145)

## 2023-08-25 LAB — HIGH SENSITIVITY TROPONIN-I: hs Troponin: <2.7 ng/L (ref <=14.0)

## 2023-08-25 LAB — URINALYSIS WITH REFLEX TO MICROSCOPIC EXAM - REFLEX TO CULTURE
Urine Bilirubin: NEGATIVE
Urine Blood: NEGATIVE
Urine Glucose: NEGATIVE
Urine Leukocyte Esterase: NEGATIVE
Urine Nitrite: NEGATIVE
Urine Protein: NEGATIVE
Urine Specific Gravity: 1.011 (ref 1.001–1.035)
Urine Urobilinogen: NORMAL mg/dL (ref 0.2–2.0)
Urine pH: 8 (ref 5.0–8.0)

## 2023-08-25 MED ORDER — DIPHENHYDRAMINE HCL 25 MG PO CAPS
50.0000 mg | ORAL_CAPSULE | Freq: Once | ORAL | Status: DC
Start: 2023-08-25 — End: 2023-08-26
  Filled 2023-08-25: qty 2

## 2023-08-25 MED ORDER — MAGNESIUM SULFATE IN D5W 1-5 GM/100ML-% IV SOLN
1.0000 g | Freq: Once | INTRAVENOUS | Status: AC
Start: 2023-08-25 — End: 2023-08-25
  Administered 2023-08-25: 1 g via INTRAVENOUS
  Filled 2023-08-25: qty 100

## 2023-08-25 MED ORDER — MORPHINE SULFATE 2 MG/ML IJ/IV SOLN (WRAP)
4.0000 mg | Freq: Once | Status: AC
Start: 2023-08-25 — End: 2023-08-25
  Administered 2023-08-25: 2 mg via INTRAVENOUS
  Filled 2023-08-25 (×2): qty 2

## 2023-08-25 MED ORDER — MORPHINE SULFATE 2 MG/ML IJ/IV SOLN (WRAP)
2.0000 mg | Freq: Once | Status: AC
Start: 2023-08-25 — End: 2023-08-25
  Administered 2023-08-25: 2 mg via INTRAVENOUS
  Filled 2023-08-25: qty 1

## 2023-08-25 MED ORDER — METHOCARBAMOL 500 MG PO TABS
1000.0000 mg | ORAL_TABLET | Freq: Once | ORAL | Status: AC
Start: 2023-08-25 — End: 2023-08-25
  Administered 2023-08-25: 1000 mg via ORAL
  Filled 2023-08-25: qty 2

## 2023-08-25 MED ORDER — SODIUM CHLORIDE 0.9 % IV BOLUS
1000.0000 mL | Freq: Once | INTRAVENOUS | Status: AC
Start: 2023-08-25 — End: 2023-08-26
  Administered 2023-08-25: 1000 mL via INTRAVENOUS

## 2023-08-25 MED ORDER — ACETAMINOPHEN 500 MG PO TABS
1000.0000 mg | ORAL_TABLET | Freq: Once | ORAL | Status: AC
Start: 2023-08-25 — End: 2023-08-25
  Administered 2023-08-25: 1000 mg via ORAL
  Filled 2023-08-25: qty 2

## 2023-08-25 MED ORDER — HYDROXYZINE HCL 25 MG PO TABS
25.0000 mg | ORAL_TABLET | Freq: Once | ORAL | Status: DC
Start: 2023-08-25 — End: 2023-08-25

## 2023-08-25 NOTE — ED Provider Notes (Signed)
 [facility] EMERGENCY DEPARTMENT  ATTENDING PHYSICIAN HISTORY AND PHYSICAL EXAM     Patient Name: Kathy Brewer, Kathy Brewer  Department:[facility] EMERGENCY DEPT  Encounter Date:  08/25/2023  Attending Physician: Dayton Scrape, MD   Age: 27 y.o. female  Patient Room: [phone]

## 2023-08-25 NOTE — EDIE (Signed)
 [facility] NOTIFICATION 08/25/2023 17:53 KESHANNA, RISO DOB: [birth year] MRN: [id number]    Criteria Met      5 ED Visits in 12 Months    Security and Safety  No Security Events were found.  ED Care Guidelines  There are currently no ED Care Guidelines

## 2023-08-25 NOTE — ED Triage Notes (Addendum)
 Pt states it feels like a band is wrapped around her chest. Also c/o head pain. States the pain feels like spasms. High anxiety in triage.

## 2023-08-26 ENCOUNTER — Observation Stay: Payer: No Typology Code available for payment source

## 2023-08-26 DIAGNOSIS — R519 Headache, unspecified: Principal | ICD-10-CM | POA: Diagnosis present

## 2023-08-26 LAB — ECG 12-LEAD
Atrial Rate: 115 {beats}/min
P Axis: 90 degrees
P-R Interval: 124 ms
Q-T Interval: 312 ms
QRS Duration: 60 ms
QTC Calculation (Bezet): 431 ms
R Axis: 84 degrees
T Axis: 43 degrees
Ventricular Rate: 115 {beats}/min

## 2023-08-26 LAB — LAB USE ONLY - URINE GRAY CULTURE HOLD TUBE

## 2023-08-26 LAB — HEPATIC FUNCTION PANEL (LFT)
ALT: 6 U/L (ref <=55)
AST (SGOT): 23 U/L (ref <=41)
Albumin/Globulin Ratio: 1.5 (ref 0.9–2.2)
Albumin: 4.2 g/dL (ref 3.5–5.0)
Alkaline Phosphatase: 61 U/L (ref 37–117)
Bilirubin Direct: 0.2 mg/dL (ref 0.0–0.5)
Bilirubin Indirect: 0.4 mg/dL (ref 0.2–1.0)
Bilirubin, Total: 0.6 mg/dL (ref 0.2–1.2)
Globulin: 2.8 g/dL (ref 2.0–3.6)
Protein, Total: 7 g/dL (ref 6.0–8.3)

## 2023-08-26 MED ORDER — PROCHLORPERAZINE EDISYLATE 10 MG/2ML IJ SOLN
5.0000 mg | Freq: Once | INTRAMUSCULAR | Status: DC
Start: 2023-08-26 — End: 2023-08-26

## 2023-08-26 MED ORDER — TRAMADOL HCL 50 MG PO TABS
50.0000 mg | ORAL_TABLET | Freq: Three times a day (TID) | ORAL | Status: DC | PRN
Start: 2023-08-26 — End: 2023-08-27

## 2023-08-26 MED ORDER — ONDANSETRON 4 MG PO TBDP
4.0000 mg | ORAL_TABLET | Freq: Four times a day (QID) | ORAL | Status: DC | PRN
Start: 2023-08-26 — End: 2023-08-27

## 2023-08-26 MED ORDER — LIDOCAINE 4 % EX CREA
TOPICAL_CREAM | CUTANEOUS | Status: DC | PRN
Start: 2023-08-26 — End: 2023-08-26
  Filled 2023-08-26: qty 5

## 2023-08-26 MED ORDER — PROCHLORPERAZINE EDISYLATE 10 MG/2ML IJ SOLN
10.0000 mg | Freq: Four times a day (QID) | INTRAMUSCULAR | Status: DC | PRN
Start: 2023-08-26 — End: 2023-08-27

## 2023-08-26 MED ORDER — SENNOSIDES-DOCUSATE SODIUM 8.6-50 MG PO TABS
2.0000 | ORAL_TABLET | Freq: Two times a day (BID) | ORAL | Status: DC | PRN
Start: 2023-08-26 — End: 2023-08-27
  Administered 2023-08-27: 2 via ORAL
  Filled 2023-08-26: qty 2

## 2023-08-26 MED ORDER — ENOXAPARIN SODIUM 40 MG/0.4ML IJ SOSY
40.0000 mg | PREFILLED_SYRINGE | Freq: Every day | INTRAMUSCULAR | Status: DC
Start: 2023-08-27 — End: 2023-08-27

## 2023-08-26 MED ORDER — MELATONIN 3 MG PO TABS
6.0000 mg | ORAL_TABLET | Freq: Every evening | ORAL | Status: DC | PRN
Start: 2023-08-26 — End: 2023-08-27

## 2023-08-26 MED ORDER — ONDANSETRON HCL 4 MG/2ML IJ SOLN
4.0000 mg | Freq: Four times a day (QID) | INTRAMUSCULAR | Status: DC | PRN
Start: 2023-08-26 — End: 2023-08-27

## 2023-08-26 MED ORDER — NALOXONE HCL 0.4 MG/ML IJ SOLN (WRAP)
0.2000 mg | INTRAMUSCULAR | Status: DC | PRN
Start: 2023-08-26 — End: 2023-08-27

## 2023-08-26 MED ORDER — POTASSIUM CHLORIDE 20 MEQ PO PACK
0.0000 meq | PACK | ORAL | Status: DC | PRN
Start: 2023-08-26 — End: 2023-08-27

## 2023-08-26 MED ORDER — MAGNESIUM SULFATE IN D5W 1-5 GM/100ML-% IV SOLN
1.0000 g | INTRAVENOUS | Status: DC | PRN
Start: 2023-08-26 — End: 2023-08-27

## 2023-08-26 MED ORDER — PANCRELIPASE (LIP-PROT-AMYL) 24000-76000 UNITS PO CPEP
1.0000 | ORAL_CAPSULE | Freq: Four times a day (QID) | ORAL | Status: DC
Start: 2023-08-26 — End: 2023-08-27
  Administered 2023-08-26 – 2023-08-27 (×3): 24000 [IU] via ORAL
  Filled 2023-08-26 (×4): qty 1

## 2023-08-26 MED ORDER — ACETAMINOPHEN 325 MG PO TABS
650.0000 mg | ORAL_TABLET | Freq: Four times a day (QID) | ORAL | Status: DC | PRN
Start: 2023-08-26 — End: 2023-08-27
  Administered 2023-08-27: 650 mg via ORAL
  Filled 2023-08-26: qty 2

## 2023-08-26 MED ORDER — MAGNESIUM SULFATE IN D5W 1-5 GM/100ML-% IV SOLN
1.0000 g | Freq: Once | INTRAVENOUS | Status: DC
Start: 2023-08-26 — End: 2023-08-26

## 2023-08-26 MED ORDER — POTASSIUM CHLORIDE 10 MEQ/100ML IV SOLN (WRAP)
10.0000 meq | INTRAVENOUS | Status: DC | PRN
Start: 2023-08-26 — End: 2023-08-27

## 2023-08-26 MED ORDER — DIPHENHYDRAMINE HCL 50 MG/ML IJ SOLN
12.5000 mg | Freq: Four times a day (QID) | INTRAMUSCULAR | Status: DC | PRN
Start: 2023-08-26 — End: 2023-08-26
  Administered 2023-08-26: 12.5 mg via INTRAVENOUS

## 2023-08-26 MED ORDER — MAGNESIUM SULFATE IN D5W 1-5 GM/100ML-% IV SOLN
1.0000 g | Freq: Once | INTRAVENOUS | Status: DC
Start: 2023-08-26 — End: 2023-08-27
  Filled 2023-08-26: qty 100

## 2023-08-26 MED ORDER — POTASSIUM CHLORIDE CRYS ER 20 MEQ PO TBCR
0.0000 meq | EXTENDED_RELEASE_TABLET | ORAL | Status: DC | PRN
Start: 2023-08-26 — End: 2023-08-27

## 2023-08-26 MED ORDER — DIPHENHYDRAMINE HCL 50 MG/ML IJ SOLN
25.0000 mg | Freq: Once | INTRAMUSCULAR | Status: AC | PRN
Start: 2023-08-26 — End: 2023-08-26
  Administered 2023-08-26: 25 mg via INTRAVENOUS
  Filled 2023-08-26: qty 1

## 2023-08-26 MED ORDER — METHOCARBAMOL 1000 MG/10ML IJ SOLN
500.0000 mg | Freq: Once | INTRAMUSCULAR | Status: DC
Start: 2023-08-26 — End: 2023-08-26

## 2023-08-26 MED ORDER — DIPHENHYDRAMINE HCL 50 MG/ML IJ SOLN
12.5000 mg | Freq: Three times a day (TID) | INTRAMUSCULAR | Status: DC | PRN
Start: 2023-08-26 — End: 2023-08-27

## 2023-08-26 MED ORDER — DIPHENHYDRAMINE HCL 50 MG/ML IJ SOLN
6.2500 mg | Freq: Once | INTRAMUSCULAR | Status: DC
Start: 2023-08-26 — End: 2023-08-27
  Filled 2023-08-26: qty 1

## 2023-08-26 MED ORDER — HYDRALAZINE HCL 20 MG/ML IJ SOLN
10.0000 mg | INTRAMUSCULAR | Status: DC | PRN
Start: 2023-08-26 — End: 2023-08-27

## 2023-08-26 MED ORDER — METHOCARBAMOL 500 MG PO TABS
500.0000 mg | ORAL_TABLET | Freq: Two times a day (BID) | ORAL | Status: DC | PRN
Start: 2023-08-26 — End: 2023-08-27
  Administered 2023-08-27: 500 mg via ORAL
  Filled 2023-08-26: qty 1

## 2023-08-26 MED ORDER — LIDOCAINE 5 % EX PTCH
1.0000 | MEDICATED_PATCH | CUTANEOUS | Status: DC
Start: 2023-08-26 — End: 2023-08-27
  Administered 2023-08-26: 1 via TRANSDERMAL
  Filled 2023-08-26 (×2): qty 1

## 2023-08-26 MED ORDER — BENGAY GREASELESS 10-15 % EX CREA
TOPICAL_CREAM | CUTANEOUS | Status: DC | PRN
Start: 2023-08-26 — End: 2023-08-27
  Filled 2023-08-26: qty 85

## 2023-08-26 MED ORDER — BUTALBITAL-APAP-CAFFEINE 50-325-40 MG PO TABS
1.0000 | ORAL_TABLET | Freq: Three times a day (TID) | ORAL | Status: DC | PRN
Start: 2023-08-26 — End: 2023-08-27

## 2023-08-26 NOTE — Discharge Instr - AVS First Page (Addendum)
 Reason for your Hospital Admission:  Headache  Right Scapular and  Shoulder Pain with Paresthesias, suspect nerve pain    The MRI of the brain and back returned normal. Neurology cleared you for discharge home with medication.      Instructions for after y

## 2023-08-26 NOTE — Nursing Progress Note (Signed)
 Adult Observation Progress Note    Shift Note:    General: Patient VSS stable, in no apparent distress at this time. Pt states having chronically low blood pressure (systolic in the 90s)  Neuro: Patient is AOx4. Patient states having numbness in right arm.

## 2023-08-26 NOTE — Plan of Care (Signed)
 Problem: Pain interferes with ability to perform ADL  Goal: Pain at adequate level as identified by patient  Outcome: Progressing  Flowsheets (Taken 08/26/2023 0303)  Pain at adequate level as identified by patient:   Identify patient comfort function go

## 2023-08-26 NOTE — UM Notes (Signed)
 PATIENT NAME: Kathy Brewer, Kathy Brewer   DOB: Mar 23, 1996       27 y.o. female hx pancreatic insufficiency on Creon, constipation on Trulance, GERD, anxiety who presents with right chest/scapula pain "like a band is wrapped around," posterior headache. This has b

## 2023-08-26 NOTE — Nursing Progress Note (Signed)
 Pt came back from MRI at 1710, got settled in bed and requested to have external cath placed. Pt had a bit of urine that got into the bed, however when RN offered to change sheets and clean pt, pt refused due to having pain every time she moved. Pt mom in

## 2023-08-26 NOTE — Plan of Care (Signed)
 CNS PLAN OF CARE    Date Time: 08/26/23 4:48 PM  Patient Name: Kathy Brewer  Attending Physician: Jerral Ralph, MD  [phone]  08/26/23  4:48 PM    Assessment:     Kathy Brewer is a 27 y.o. female with a past medical history of pa

## 2023-08-26 NOTE — ED to IP RN Note (Signed)
 [facility] HOSP[facility] EMERGENCY DEPT  ED NURSING NOTE FOR THE RECEIVING INPATIENT NURSE   ED NURSE Charna Busman   [facility] [id number]   ED CHARGE RN Corrie Dandy   ADMISSION INFORMATION   Kathy Brewer is a 27 y.o. female admitted with an ED diagnosis of:    1.

## 2023-08-26 NOTE — H&P (Addendum)
 CNS HOSPITALIST ADMISSION HISTORY AND PHYSICAL EXAM    Date Time: 08/26/23 12:23 AM  Patient Name: Kathy Brewer  Attending Physician: Dayton Scrape, MD  Primary Care Physician: Benjaman Kindler, PA    CC: right chest/scapula pain "like a band is wrappe

## 2023-08-26 NOTE — Nursing Progress Note (Signed)
 Adult Observation Progress Note    Shift Note:    General: Patient VSS , in no apparent distress at this time.The patient is alert and oriented but not  cooperative to communicate well.  Neuro: Patient is AOx4. Patient denies numbness or tingling. Perrla.

## 2023-08-26 NOTE — Progress Notes (Signed)
 NURSING ADMISSION NOTE          Date of Admission:   08/24/23     Reason for Admission:   Headache     Admitted from:   Home     Nursing Note:   Patient Aox4. English speaking, and VSS. Not on tele and alerted Charge Nurse Mathis Fare. Oriented patient to the f

## 2023-08-26 NOTE — Plan of Care (Signed)
 CNS Note    Notified by RNs that patient only wanting IV benadryl, not wanting to try any other meds that we offered or discussed during trio rounds.     She told us on rounds headache was better and didn't want headache cocktail. Thus no indication for fr

## 2023-08-26 NOTE — Progress Notes (Signed)
 Attending Attestation:     I have seen and personally examined the patient.  I agree with the history, exam, assessment, and management plans as documented by NP Laurence Compton.  I concur with or have edited all elements of the provider's note, with any caveats as

## 2023-08-26 NOTE — Nursing Progress Note (Signed)
Pt unable to tolerate laying flat long enough to complete MRI due to pain/discomfort, per MRI tech. Pt was premedicated w/ benadryl IV prior to being sent down but was still unable to complete MRI.

## 2023-08-26 NOTE — Plan of Care (Signed)
 Problem: Pain interferes with ability to perform ADL  Goal: Pain at adequate level as identified by patient  Outcome: Progressing  Flowsheets (Taken 08/26/2023 0303 by Clemens Catholic, RN)  Pain at adequate level as identified by patient:   Identify patient

## 2023-08-26 NOTE — Plan of Care (Signed)
Patient unable to tolerate lying supine for MRI 2/2 pain.  She is declining tramadol at this time.  Explained to patient additional narcotics contraindicated given her low SBP trends.  MRI to be rescheduled with general anesthesia.

## 2023-08-26 NOTE — PT Progress Note (Signed)
 Physical Therapy Cancellation Note      Patient:  Kathy Brewer MRN#:  [id number]  Unit:  [facility] TOWER 6 Room/Bed:  V409/W119.14    PT Cancellation: Visit  PT Visit Cancellation Reason: Not clinically indicated at this time (comm

## 2023-08-26 NOTE — Plan of Care (Addendum)
 CNS Note    Trio rounding. Still awaiting MRI    Offered headache cocktail and she says she does not want it.     Only wants magnesium/benadryl.   Scared to get out of bed, she is wiling to try bedside commode    Ordered for PT.    Intense pulling sensatio

## 2023-08-26 NOTE — Plan of Care (Signed)
 Problem: Pain interferes with ability to perform ADL  Goal: Pain at adequate level as identified by patient  Flowsheets (Taken 08/26/2023 0303 by Clemens Catholic, RN)  Pain at adequate level as identified by patient:   Identify patient comfort function goal

## 2023-08-26 NOTE — Progress Notes (Signed)
08/26/23 1008   Case Management Quick Doc   Acknowledgment of Outpatient/Observation Observation letter given   CMA Tasks   CMA tasks MOON delivered     Letter sent to patient's mychart @817am .  CMS called main # on file for patient and left VM.

## 2023-08-26 NOTE — Consults (Addendum)
 [facility] Consultation Note    Date/Time: 08/26/23 10:00 AM  Patient Name: Kathy Brewer  Requesting Physician: Jerral Ralph, MD  Date of Admission: 08/25/2023    CC / Reason for Consultation: Headache    Patient is covered by [facility]

## 2023-08-27 ENCOUNTER — Observation Stay: Payer: No Typology Code available for payment source

## 2023-08-27 ENCOUNTER — Observation Stay: Payer: No Typology Code available for payment source | Admitting: Critical Care Medicine

## 2023-08-27 DIAGNOSIS — M898X1 Other specified disorders of bone, shoulder: Secondary | ICD-10-CM

## 2023-08-27 LAB — CBC
Absolute nRBC: 0 10*3/uL (ref <=0.00)
Hematocrit: 34.5 % — ABNORMAL LOW (ref 34.7–43.7)
Hemoglobin: 11.5 g/dL (ref 11.4–14.8)
MCH: 32.7 pg (ref 25.1–33.5)
MCHC: 33.3 g/dL (ref 31.5–35.8)
MCV: 98 fL — ABNORMAL HIGH (ref 78.0–96.0)
MPV: 11.3 fL (ref 8.9–12.5)
Platelet Count: 192 10*3/uL (ref 142–346)
RBC: 3.52 10*6/uL — ABNORMAL LOW (ref 3.90–5.10)
RDW: 13 % (ref 11–15)
WBC: 5.32 10*3/uL (ref 3.10–9.50)
nRBC %: 0 /100{WBCs} (ref <=0.0)

## 2023-08-27 LAB — BASIC METABOLIC PANEL
Anion Gap: 8 (ref 5.0–15.0)
BUN: 9 mg/dL (ref 7–21)
CO2: 20 meq/L (ref 17–29)
Calcium: 9.1 mg/dL (ref 8.5–10.5)
Chloride: 108 meq/L (ref 99–111)
Creatinine: 0.8 mg/dL (ref 0.4–1.0)
GFR: >60 mL/min/{1.73_m2} (ref >=60.0)
Glucose: 77 mg/dL (ref 70–100)
Potassium: 4.2 meq/L (ref 3.5–5.3)
Sodium: 136 meq/L (ref 135–145)

## 2023-08-27 LAB — PT AND APTT
INR: 1.3 (ref 0.9–1.1)
PT: 14.2 s — ABNORMAL HIGH (ref 10.1–12.9)
PTT: 32 s (ref 27–39)

## 2023-08-27 MED ORDER — NORTRIPTYLINE HCL 10 MG PO CAPS
10.0000 mg | ORAL_CAPSULE | Freq: Every evening | ORAL | 0 refills | Status: AC
Start: 2023-08-27 — End: ?

## 2023-08-27 MED ORDER — GLYCOPYRROLATE 0.2 MG/ML IJ SOLN (WRAP)
INTRAMUSCULAR | Status: AC
Start: 2023-08-27 — End: ?
  Filled 2023-08-27: qty 1

## 2023-08-27 MED ORDER — PROPOFOL 10 MG/ML IV EMUL (WRAP)
INTRAVENOUS | Status: AC
Start: 2023-08-27 — End: ?
  Filled 2023-08-27: qty 20

## 2023-08-27 MED ORDER — MEPERIDINE HCL 25 MG/ML IJ SOLN
12.5000 mg | INTRAMUSCULAR | Status: DC | PRN
Start: 2023-08-27 — End: 2023-08-27

## 2023-08-27 MED ORDER — LIDOCAINE HCL 2 % IJ SOLN
INTRAMUSCULAR | Status: DC | PRN
Start: 2023-08-27 — End: 2023-08-27
  Administered 2023-08-27: 100 mg via INTRAVENOUS

## 2023-08-27 MED ORDER — DEXMEDETOMIDINE HCL 200 MCG/2ML IV SOLN
INTRAVENOUS | Status: AC
Start: 2023-08-27 — End: ?
  Filled 2023-08-27: qty 2

## 2023-08-27 MED ORDER — ACETAMINOPHEN 325 MG PO TABS
650.0000 mg | ORAL_TABLET | Freq: Four times a day (QID) | ORAL | Status: AC | PRN
Start: 2023-08-27 — End: ?

## 2023-08-27 MED ORDER — IBUPROFEN 600 MG PO TABS
600.0000 mg | ORAL_TABLET | Freq: Once | ORAL | Status: DC | PRN
Start: 2023-08-27 — End: 2023-08-27

## 2023-08-27 MED ORDER — LIDOCAINE 5 % EX PTCH
1.0000 | MEDICATED_PATCH | CUTANEOUS | 0 refills | Status: AC
Start: 2023-08-27 — End: ?

## 2023-08-27 MED ORDER — NORTRIPTYLINE HCL 10 MG PO CAPS
10.0000 mg | ORAL_CAPSULE | Freq: Every evening | ORAL | Status: DC
Start: 2023-08-27 — End: 2023-08-27
  Filled 2023-08-27: qty 1

## 2023-08-27 MED ORDER — ONDANSETRON HCL 4 MG/2ML IJ SOLN
4.0000 mg | Freq: Once | INTRAMUSCULAR | Status: DC | PRN
Start: 2023-08-27 — End: 2023-08-27

## 2023-08-27 MED ORDER — PROPOFOL INFUSION 10 MG/ML
INTRAVENOUS | Status: DC | PRN
Start: 2023-08-27 — End: 2023-08-27
  Administered 2023-08-27: 200 mg via INTRAVENOUS

## 2023-08-27 MED ORDER — OXYCODONE HCL 5 MG PO TABS
5.0000 mg | ORAL_TABLET | Freq: Once | ORAL | Status: DC | PRN
Start: 2023-08-27 — End: 2023-08-27

## 2023-08-27 MED ORDER — MIDAZOLAM HCL 1 MG/ML IJ SOLN (WRAP)
INTRAMUSCULAR | Status: AC
Start: 2023-08-27 — End: ?
  Filled 2023-08-27: qty 2

## 2023-08-27 MED ORDER — PHENYLEPHRINE 100 MCG/ML IV BOLUS (ANESTHESIA)
PREFILLED_SYRINGE | INTRAVENOUS | Status: DC | PRN
Start: 2023-08-27 — End: 2023-08-27
  Administered 2023-08-27 (×4): 200 ug via INTRAVENOUS

## 2023-08-27 MED ORDER — ONDANSETRON HCL 4 MG/2ML IJ SOLN
INTRAMUSCULAR | Status: AC
Start: 2023-08-27 — End: ?
  Filled 2023-08-27: qty 2

## 2023-08-27 MED ORDER — METOPROLOL TARTRATE 5 MG/5ML IV SOLN
2.5000 mg | INTRAVENOUS | Status: DC | PRN
Start: 2023-08-28 — End: 2023-08-27

## 2023-08-27 MED ORDER — FENTANYL CITRATE (PF) 50 MCG/ML IJ SOLN (WRAP)
25.0000 ug | INTRAMUSCULAR | Status: DC | PRN
Start: 2023-08-27 — End: 2023-08-27

## 2023-08-27 MED ORDER — PHENYLEPHRINE 100 MCG/ML IV SOSY (WRAP)
PREFILLED_SYRINGE | INTRAVENOUS | Status: AC
Start: 2023-08-27 — End: ?
  Filled 2023-08-27: qty 10

## 2023-08-27 MED ORDER — METOPROLOL TARTRATE 5 MG/5ML IV SOLN
5.0000 mg | Freq: Once | INTRAVENOUS | Status: DC | PRN
Start: 2023-08-27 — End: 2023-08-27

## 2023-08-27 MED ORDER — GLYCOPYRROLATE 0.2 MG/ML IJ SOLN (WRAP)
INTRAMUSCULAR | Status: DC | PRN
Start: 2023-08-27 — End: 2023-08-27
  Administered 2023-08-27: .1 mg via INTRAVENOUS

## 2023-08-27 MED ORDER — SODIUM CHLORIDE 0.9 % IV SOLN
INTRAVENOUS | Status: DC | PRN
Start: 2023-08-27 — End: 2023-08-27
  Administered 2023-08-27 (×3): 10 ug via INTRAVENOUS

## 2023-08-27 MED ORDER — GADOTERATE MEGLUMINE 7.5 MMOL/15ML IV SOLN (CLARISCAN)
0.2000 mL/kg | Freq: Once | INTRAVENOUS | Status: AC | PRN
Start: 2023-08-27 — End: 2023-08-27
  Administered 2023-08-27: 12.7 mL via INTRAVENOUS

## 2023-08-27 MED ORDER — LIDOCAINE HCL (PF) 2 % IJ SOLN
INTRAMUSCULAR | Status: AC
Start: 2023-08-27 — End: ?
  Filled 2023-08-27: qty 5

## 2023-08-27 MED ORDER — HYDRALAZINE HCL 20 MG/ML IJ SOLN
10.0000 mg | Freq: Once | INTRAMUSCULAR | Status: DC | PRN
Start: 2023-08-27 — End: 2023-08-27

## 2023-08-27 MED ORDER — ONDANSETRON HCL 4 MG/2ML IJ SOLN
INTRAMUSCULAR | Status: DC | PRN
Start: 2023-08-27 — End: 2023-08-27
  Administered 2023-08-27: 4 mg via INTRAVENOUS

## 2023-08-27 MED ORDER — ACETAMINOPHEN 500 MG PO TABS
1000.0000 mg | ORAL_TABLET | Freq: Once | ORAL | Status: DC | PRN
Start: 2023-08-27 — End: 2023-08-27

## 2023-08-27 NOTE — PT Progress Note (Signed)
 [facility]   Physical Therapy Cancellation Note      Patient:  Kathy Brewer MRN#:  [id number]  Unit:  [facility] TOWER 6 Room/Bed:  I347/Q259.56    08/27/2023  Time: 7:46 AM       PT Cancellation: Visit  PT Visit Cancell

## 2023-08-27 NOTE — Progress Notes (Signed)
 Neurology progress note    Symptoms slightly improved today. Still with some discomfort but able to move her R arm more comfortably. No new complaints.    On exam, patient is resting comfortably and eating her lunch. No aphasia or dysarthria. EOMI, face sy

## 2023-08-27 NOTE — Anesthesia Postprocedure Evaluation (Signed)
 Anesthesia Post Evaluation    Patient: Kathy Brewer    * No procedures listed *    Anesthesia type: general    Last Vitals:   Vitals Value Taken Time   BP 92/53 08/27/23 1010   Temp 37.9 C (100.3 F) 08/27/23 1010   Pulse 95 08/27/23 1010   Resp

## 2023-08-27 NOTE — Progress Notes (Signed)
 Attending Attestation:     I have seen and personally examined the patient.  I agree with the history, exam, assessment, and management plans as documented by NP Morrison Old.  I concur with or have edited all elements of the provider's note, with any caveats as

## 2023-08-27 NOTE — Discharge Summary (Signed)
 MEDICINE PROGRESS NOTE/DISCHARGE SUMMARY  CONDITIONAL DISCHARGE: No         Date Time: 08/27/23 5:01 PM  Patient Name: Kathy Brewer  Attending Physician: Jerral Ralph, MD  Primary Care Physician: Benjaman Kindler, PA    Date of Admission: 11/

## 2023-08-27 NOTE — Nursing Progress Note (Addendum)
Patient refused AM labs during night shift, will inform day shift team to re attempt labs.

## 2023-08-27 NOTE — Progress Notes (Signed)
 ADULT OBSERVATION UNIT  DISCHARGE NOTE        Date Time: 08/27/2023 at 2045  Patient Name: Kathy Brewer  Attending Physician: Dr. Joni Fears        Date of Admission:   08/26/2023           Discharge Instructions:                   Patient stable for discharge.

## 2023-08-27 NOTE — Plan of Care (Signed)
 Problem: Pain interferes with ability to perform ADL  Goal: Pain at adequate level as identified by patient  Outcome: Progressing  Flowsheets (Taken 08/26/2023 0303 by Clemens Catholic, RN)  Pain at adequate level as identified by patient:   Identify patient

## 2023-08-27 NOTE — Anesthesia Preprocedure Evaluation (Signed)
 Anesthesia Evaluation    AIRWAY    Mallampati: II    TM distance: >3 FB  Neck ROM: full  Mouth Opening:full   CARDIOVASCULAR    cardiovascular exam normal       DENTAL    no notable dental hx               PULMONARY    pulmonary exam normal     OTHER FINDI

## 2023-08-27 NOTE — Progress Notes (Signed)
Pt refused Vital sign to be taken

## 2023-08-27 NOTE — Transfer of Care (Signed)
 Anesthesia Transfer of Care Note    Patient: Kathy Brewer    Procedures performed: * No procedures listed *    Anesthesia type: General LMA    Patient location:Phase I PACU    Last vitals:   Vitals:    08/27/23 1010   BP: 92/53   Pulse: 95   Resp:

## 2023-08-27 NOTE — Progress Notes (Signed)
Discharge paper works reviewed and given, IV removed, Pt reports of the same right side pulling chest pain offer pain meds but pt refused. Awaiting  for parent to pick her up.
